# Patient Record
Sex: Male | Born: 1975 | Race: Black or African American | Hispanic: No | Marital: Married | Smoking: Current some day smoker
Health system: Southern US, Community
[De-identification: ages and names within clinical notes are randomized; demographics above are authoritative.]

## PROBLEM LIST (undated history)

## (undated) DIAGNOSIS — I252 Old myocardial infarction: Secondary | ICD-10-CM

## (undated) HISTORY — PX: CARDIAC CATHETERIZATION: SHX172

---

## 2004-08-13 ENCOUNTER — Emergency Department (HOSPITAL_COMMUNITY): Admission: EM | Admit: 2004-08-13 | Discharge: 2004-08-13 | Payer: Self-pay | Admitting: Emergency Medicine

## 2005-01-02 ENCOUNTER — Ambulatory Visit: Payer: Self-pay | Admitting: Gastroenterology

## 2006-08-10 ENCOUNTER — Emergency Department (HOSPITAL_COMMUNITY): Admission: EM | Admit: 2006-08-10 | Discharge: 2006-08-10 | Payer: Self-pay | Admitting: Emergency Medicine

## 2007-11-15 ENCOUNTER — Encounter: Payer: Self-pay | Admitting: Emergency Medicine

## 2007-11-15 ENCOUNTER — Inpatient Hospital Stay (HOSPITAL_COMMUNITY): Admission: AD | Admit: 2007-11-15 | Discharge: 2007-11-18 | Payer: Self-pay | Admitting: Cardiovascular Disease

## 2007-11-16 ENCOUNTER — Encounter (INDEPENDENT_AMBULATORY_CARE_PROVIDER_SITE_OTHER): Payer: Self-pay | Admitting: Cardiovascular Disease

## 2007-12-27 ENCOUNTER — Inpatient Hospital Stay (HOSPITAL_COMMUNITY): Admission: AD | Admit: 2007-12-27 | Discharge: 2007-12-30 | Payer: Self-pay | Admitting: Cardiovascular Disease

## 2007-12-27 ENCOUNTER — Encounter: Payer: Self-pay | Admitting: Emergency Medicine

## 2008-02-26 ENCOUNTER — Encounter (HOSPITAL_COMMUNITY): Admission: RE | Admit: 2008-02-26 | Discharge: 2008-05-26 | Payer: Self-pay | Admitting: Cardiovascular Disease

## 2008-03-28 ENCOUNTER — Emergency Department (HOSPITAL_COMMUNITY): Admission: EM | Admit: 2008-03-28 | Discharge: 2008-03-29 | Payer: Self-pay | Admitting: Emergency Medicine

## 2008-05-27 ENCOUNTER — Encounter (HOSPITAL_COMMUNITY): Admission: RE | Admit: 2008-05-27 | Discharge: 2008-07-02 | Payer: Self-pay | Admitting: Cardiovascular Disease

## 2008-08-20 ENCOUNTER — Emergency Department (HOSPITAL_COMMUNITY): Admission: EM | Admit: 2008-08-20 | Discharge: 2008-08-20 | Payer: Self-pay | Admitting: Emergency Medicine

## 2009-12-28 ENCOUNTER — Emergency Department (HOSPITAL_COMMUNITY): Admission: EM | Admit: 2009-12-28 | Discharge: 2009-12-28 | Payer: Self-pay | Admitting: Family Medicine

## 2010-09-11 ENCOUNTER — Emergency Department (HOSPITAL_COMMUNITY)
Admission: EM | Admit: 2010-09-11 | Discharge: 2010-09-11 | Disposition: A | Discharge status: Home / Self Care | Payer: BC Managed Care – PPO | Attending: Emergency Medicine | Admitting: Emergency Medicine

## 2010-09-11 DIAGNOSIS — L299 Pruritus, unspecified: Secondary | ICD-10-CM | POA: Insufficient documentation

## 2010-10-10 ENCOUNTER — Emergency Department (HOSPITAL_COMMUNITY)
Admission: EM | Admit: 2010-10-10 | Discharge: 2010-10-10 | Disposition: A | Discharge status: Home / Self Care | Payer: BC Managed Care – PPO | Attending: Emergency Medicine | Admitting: Emergency Medicine

## 2010-10-10 DIAGNOSIS — L299 Pruritus, unspecified: Secondary | ICD-10-CM | POA: Insufficient documentation

## 2010-11-28 NOTE — Cardiovascular Report (Signed)
Drew Hubbard, Drew Hubbard               ACCOUNT NO.:  [id number]   MEDICAL RECORD NO.:  [id number]          PATIENT TYPE:  INP   LOCATION:  3306                         FACILITY:  [facility]   PHYSICIAN:  Ricki Rodriguez, M.D.  DATE OF BIRTH:  04-May-1976   DATE OF PROCEDURE:  11/17/2007  DATE OF DISCHARGE:  11/18/2007                            CARDIAC CATHETERIZATION   PROCEDURES:  1. Left heart catheterization.  2. Selective coronary angiography.   INDICATIONS:  This 35 year old black male had non-Q-wave myocardial  infarction along with a history of cocaine use.   I approached right femoral artery using 5-French sheath.   COMPLICATIONS:  None.   CORONARY ANATOMY:  The left main coronary artery was unremarkable.   LEFT ANTERIOR DESCENDING CORONARY ARTERY:  The left anterior descending  coronary artery showed proximal-to-mid one-third ejection and 40%  concentric lesion with a slow filling distally.   LEFT CIRCUMFLEX CORONARY ARTERY:  The left circumflex coronary artery  was unremarkable.   RIGHT CORONARY ARTERY:  The right coronary artery was also unremarkable.   LEFT VENTRICULOGRAM:  The left ventriculogram showed a 60% ejection  fraction by echocardiogram.   IMPRESSION:  1. One-vessel coronary artery disease.  2. Preserved left ventricular systolic function.   RECOMMENDATIONS:  This patient will be treated medically with lifestyle  modification.      Ricki Rodriguez, M.D.  Electronically Signed     ASK/MEDQ  D:  12/27/2007  T:  12/28/2007  Job:  [id number]

## 2010-11-28 NOTE — H&P (Signed)
Drew, Hubbard               ACCOUNT NO.:  [id number]   MEDICAL RECORD NO.:  [id number]          PATIENT TYPE:  INP   LOCATION:  2906                         FACILITY:  [facility]   PHYSICIAN:  Ricki Rodriguez, M.D.  DATE OF BIRTH:  1975/09/11   DATE OF ADMISSION:  12/27/2007  DATE OF DISCHARGE:                              HISTORY & PHYSICAL   The patient admitted by Ricki Rodriguez, MD   The patient referred by [facility].   CHIEF COMPLAINT:  Chest pain.   HISTORY OF PRESENT ILLNESS:  This 35 year old black male had recurrent  chest pain after taking cocaine, alcohol, marijuana, and some other  medications/drugs.  The patient had a similar episode about a month ago  at which time the cardiac catheterization had showed 40% eccentric  lesion.  At this time his EKG changes are remarkable with acute  anterolateral wall injury.  At the time of cath lab arrival, the patient  was chest pain free.   PAST MEDICAL HISTORY:  Negative for diabetes.  Negative for  hypertension.  Positive for smoking, one pack of cigarettes per day for  5-10 years.  Positive for alcohol use and positive for drug abuse.  Negative for elevated cholesterol level.  Positive for exercise.   FAMILY HISTORY:  Mother alive in her 1s.  Father alive in his 50s.  The  patient has no brother and has four sisters.   SOCIAL HISTORY:  The patient is married and works for [employer] and  [employer].   DRUG ALLERGIES:  No known drug allergies.   MEDICATIONS:  The patient is suppose to be on 4-5 medications and does  not remember the name, but he is at least taking aspirin and possibly  Plavix 75 mg one daily.  He was also suppose to be on lisinopril,  Norvasc, and Simvastatin.   REVIEW OF SYSTEMS:  Negative for weight gain or weight loss.  Negative  for hearing loss.  Negative for GI bleed, kidney stones, strokes,  seizures, or psychiatric admissions.  Positive for chest pain.   PHYSICAL EXAMINATION:  VITAL  SIGNS:  Pulse 73, respiration 12, blood  pressure 110/60, temperature 98, oxygen saturation 99% on 2 liters of  oxygen.  GENERAL:  The patient is well-built, well-nourished black male in no  acute distress.  HEENT:  The patient is normocephalic, atraumatic.  He has short hair,  brown eyes, and pupils equally reacting to light.  Extraocular movement  intact.  NECK:  No JVD.  LUNGS:  Clear bilaterally.  HEART:  Normal S1-S2.  ABDOMEN:  Soft and nontender.  EXTREMITIES:  Negative edema, cyanosis, clubbing.  Peripheral pulses  were 2+.  SKIN:  Warm and dry.  NEUROLOGIC:  The patient moves all 4 extremities.  He has slightly flat  affect.   LABORATORY DATA:  From [facility] pending.  EKG showed acute ST  elevations in V2, V3, V4, V5, and V6 and also slight ST elevations in  II, III, and aVF   IMPRESSION:  1. Acute anterolateral wall myocardial infarction.  2. Coronary artery disease.   PLAN  OF ACTION:  The patient will undergo emergent cardiac  catheterization with possible angioplasty.  Dr. Jacinto Halim was notified.      Ricki Rodriguez, M.D.  Electronically Signed     ASK/MEDQ  D:  12/27/2007  T:  12/27/2007  Job:  [id number]

## 2010-11-28 NOTE — Discharge Summary (Signed)
[name], Hubbard               ACCOUNT NO.:  [id number]   MEDICAL RECORD NO.:  [id number]          PATIENT TYPE:  INP   LOCATION:  2030                         FACILITY:  [facility]   PHYSICIAN:  Ricki Rodriguez, M.D.  DATE OF BIRTH:  11-Jan-1976   DATE OF ADMISSION:  12/27/2007  DATE OF DISCHARGE:  12/30/2007                               DISCHARGE SUMMARY   FINAL DIAGNOSES:  1. Anterolateral acute myocardial infarction, initial episode.  2. Coronary atherosclerosis of native coronary vessel.  3. Tobacco use disorder.  4. Cannabis abuse.  5. Cocaine abuse.  6. Alcohol abuse.   PRINCIPAL PROCEDURE:  Left heart catheterization, coronary angiography  done by Dr. Orpah Cobb, and PTCA stent placement by Dr. Yates Decamp.   DISCHARGE MEDICATIONS:  1. Aspirin 325 mg 1 daily.  2. Plavix 75 mg 1 daily.  3. Simvastatin 80 mg 1 daily.  4. Lisinopril 5 mg 1 daily.  5. Amlodipine 5 mg half a tablet daily.  6. Coreg 6.25 mg 1 twice daily.  7. Nicotine patch 21 mg apply new one daily.  8. Nitroglycerin 0.4 mg tablet 1 under the tongue every 5 minutes x3      as needed for chest pain.   DISCHARGE ACTIVITY:  The patient to increase activity slowly.   WOUND CARE INSTRUCTIONS:  The patient to notify right groin pain,  discharge.   DISCHARGE DIET:  Low-sodium, heart-healthy diet.   SPECIAL INSTRUCTION:  The patient to stop any activity that causes chest  pain, shortness of breath, dizziness, sweating, or excessive weakness.   CONDITION ON DISCHARGE:  Improved.   FOLLOWUP:  By Dr. Orpah Cobb in 1-2 weeks.  The patient to call [phone] for appointment.   HISTORY:  This is a 35 year old black male presented with chest pain  recurrent along with some cocaine abuse.   PHYSICAL EXAMINATION:  GENERAL:  Temperature 97.8, pulse 55,  respirations 16, blood pressure 113/73, and oxygen saturation 99% on  room air.  HEENT:  The patient is normocephalic and atraumatic.  Has brown eyes.  NECK:   No JVD.  No carotid bruits.  LUNGS:  Clear bilaterally.  HEART:  Normal S1, S2 without S3 or gallop.  ABDOMEN:  Soft and nontender.  EXTREMITIES:  No edema, cyanosis, or clubbing.   LABORATORY DATA:  Normal hemoglobin, hematocrit, slightly low WBC count,  normal platelet count, normal electrolytes, BUN, and creatinine.  Sugar  borderline at 101.  CK 276, MB 15.5, troponin I is 1.7.  Subsequent CK-  MB peaked at 312, MB of 20.5, and troponin I of 2.46.  Cholesterol  profile near normal.   Cardiac catheterization showed significant left anterior descending  coronary artery disease, PTCA stent of LAD with a 4-mm x 12-mm Multi-  Link Vision stent was successful.   HOSPITAL COURSE:  The patient was admitted to Telemetry unit, myocardial  infarction was ruled in.  His chest pain, abnormal EKG, and abnormal  blood work required the patient to be brought to the cardiac  catheterization laboratory.  He underwent PTCA stent placement in left  anterior descending  coronary artery due to eccentric 52% stenosis with  ruptured flap.  His overall condition gradually improved.  His activity  was increased and on December 30, 2007, he was discharged home in  satisfactory condition with adjustment in his medications and a strong  advise on changing the lifestyle.      Ricki Rodriguez, M.D.  Electronically Signed     ASK/MEDQ  D:  03/15/2008  T:  03/16/2008  Job:  [id number]

## 2011-04-12 LAB — BASIC METABOLIC PANEL
BUN: 12
CO2: 23
Calcium: 9.6
Calcium: 8.7
Chloride: 111
Chloride: 109
Creatinine, Ser: 1.12
GFR calc Af Amer: >60
GFR calc Af Amer: >60
GFR calc non Af Amer: >60
Glucose, Bld: 100 — ABNORMAL HIGH
Glucose, Bld: 101 — ABNORMAL HIGH
Potassium: 3.1 — ABNORMAL LOW
Potassium: 3.8
Sodium: 141

## 2011-04-12 LAB — URINALYSIS, ROUTINE W REFLEX MICROSCOPIC
Bilirubin Urine: NEGATIVE
Nitrite: NEGATIVE
Protein, ur: NEGATIVE
Specific Gravity, Urine: >1.03 — ABNORMAL HIGH
Urobilinogen, UA: 0.2

## 2011-04-12 LAB — CBC
HCT: 43.7
Hemoglobin: 13.3
MCHC: 33.8
MCHC: 33.7
MCV: 86.9
Platelets: 228
RBC: 5.02
RDW: 12.5
WBC: 6.3

## 2011-04-12 LAB — RAPID URINE DRUG SCREEN, HOSP PERFORMED
Barbiturates: NOT DETECTED
Benzodiazepines: NOT DETECTED

## 2011-04-12 LAB — CK TOTAL AND CKMB (NOT AT ARMC)
Relative Index: 0.8
Relative Index: 1.6
Total CK: 233 — ABNORMAL HIGH
Total CK: 171

## 2011-04-12 LAB — CARDIAC PANEL(CRET KIN+CKTOT+MB+TROPI)
CK, MB: 14 — ABNORMAL HIGH
CK, MB: 20.5 — ABNORMAL HIGH
Relative Index: 5.3 — ABNORMAL HIGH
Relative Index: 6.6 — ABNORMAL HIGH
Total CK: 262 — ABNORMAL HIGH
Total CK: 276 — ABNORMAL HIGH
Total CK: 312 — ABNORMAL HIGH
Troponin I: 2.46

## 2011-04-12 LAB — DIFFERENTIAL
Eosinophils Absolute: 0.1
Lymphocytes Relative: 44
Monocytes Absolute: 0.5

## 2011-04-12 LAB — LIPID PANEL
Cholesterol: 116
VLDL: 22

## 2011-04-23 ENCOUNTER — Emergency Department (HOSPITAL_COMMUNITY): Payer: BC Managed Care – PPO

## 2011-04-23 ENCOUNTER — Emergency Department (HOSPITAL_COMMUNITY)
Admission: EM | Admit: 2011-04-23 | Discharge: 2011-04-23 | Disposition: A | Discharge status: Home / Self Care | Payer: BC Managed Care – PPO | Attending: Emergency Medicine | Admitting: Emergency Medicine

## 2011-04-23 ENCOUNTER — Encounter: Payer: Self-pay | Admitting: Emergency Medicine

## 2011-04-23 DIAGNOSIS — R059 Cough, unspecified: Secondary | ICD-10-CM

## 2011-04-23 DIAGNOSIS — R062 Wheezing: Secondary | ICD-10-CM | POA: Insufficient documentation

## 2011-04-23 DIAGNOSIS — F17200 Nicotine dependence, unspecified, uncomplicated: Secondary | ICD-10-CM | POA: Insufficient documentation

## 2011-04-23 DIAGNOSIS — R05 Cough: Secondary | ICD-10-CM

## 2011-04-23 MED ORDER — GUAIFENESIN-CODEINE 100-10 MG/5ML PO SYRP: ORAL_SOLUTION | ORAL | Status: DC

## 2011-04-23 MED ORDER — ALBUTEROL SULFATE HFA 108 (90 BASE) MCG/ACT IN AERS
2.0000 | INHALATION_SPRAY | RESPIRATORY_TRACT | Status: DC | PRN
  Administered 2011-04-23: 2 via RESPIRATORY_TRACT
  Filled 2011-04-23: qty 6.7

## 2011-04-23 NOTE — ED Notes (Signed)
Cough/congested with sob x 1 week. Nad. No resp distress noted. Denies prod cough.

## 2011-04-23 NOTE — ED Provider Notes (Signed)
History     CSN: [id number] Arrival date & time: 04/23/2011  7:22 AM  Chief Complaint  Patient presents with  . Cough    (Consider location/radiation/quality/duration/timing/severity/associated sxs/prior treatment) Patient is a 35 y.o. male presenting with cough. The history is provided by the patient. No language interpreter was used.  Cough This is a new problem. The current episode started more than 1 week ago. The problem occurs every few minutes. The problem has not changed since onset.The cough is non-productive. There has been no fever. Associated symptoms include wheezing. Pertinent negatives include no chest pain, no chills, no sweats and no shortness of breath. He has tried nothing for the symptoms. He is a smoker. His past medical history does not include bronchitis, pneumonia, bronchiectasis, COPD, emphysema or asthma.    History reviewed. No pertinent past medical history.  History reviewed. No pertinent past surgical history.  History reviewed. No pertinent family history.  History  Substance Use Topics  . Smoking status: Current Some Day Smoker  . Smokeless tobacco: Not on file  . Alcohol Use: No      Review of Systems  Constitutional: Negative for chills.  Respiratory: Positive for cough and wheezing. Negative for shortness of breath.   Cardiovascular: Negative for chest pain.  All other systems reviewed and are negative.    Allergies  Review of patient's allergies indicates not on file.  Home Medications  No current outpatient prescriptions on file.  BP 141/64  Pulse 84  Temp(Src) 98.3 F (36.8 C) (Oral)  Resp 19  SpO2 97%  Physical Exam  Nursing note and vitals reviewed. Constitutional: He is oriented to person, place, and time. Vital signs are normal. He appears well-developed and well-nourished. No distress.  HENT:  Head: Normocephalic and atraumatic.  Right Ear: External ear normal.  Left Ear: External ear normal.  Nose: Nose normal.    Mouth/Throat: No oropharyngeal exudate.  Eyes: Conjunctivae and EOM are normal. Pupils are equal, round, and reactive to light. Right eye exhibits no discharge. Left eye exhibits no discharge. No scleral icterus.  Neck: Normal range of motion. Neck supple. No JVD present. No tracheal deviation present. No thyromegaly present.  Cardiovascular: Normal rate, regular rhythm, normal heart sounds, intact distal pulses and normal pulses.  Exam reveals no gallop and no friction rub.   No murmur heard. Pulmonary/Chest: Effort normal. No stridor. No respiratory distress. He has wheezes in the left upper field. He has no rhonchi. He has no rales. He exhibits no tenderness.  Abdominal: Soft. Normal appearance and bowel sounds are normal. He exhibits no distension and no mass. There is no tenderness. There is no rebound and no guarding.  Musculoskeletal: Normal range of motion. He exhibits no edema and no tenderness.  Lymphadenopathy:    He has no cervical adenopathy.  Neurological: He is alert and oriented to person, place, and time. He has normal reflexes. Coordination normal. GCS eye subscore is 4. GCS verbal subscore is 5. GCS motor subscore is 6.  Skin: Skin is warm and dry. No rash noted. He is not diaphoretic.  Psychiatric: He has a normal mood and affect. His speech is normal and behavior is normal. Judgment and thought content normal. Cognition and memory are normal.    ED Course  Procedures (including critical care time)  Labs Reviewed - No data to display No results found.   No diagnosis found.    MDM          Worthy Rancher, PA 04/23/11 [phone]

## 2011-04-25 NOTE — ED Provider Notes (Signed)
Medical screening examination/treatment/procedure(s) were performed by non-physician practitioner and as supervising physician I was immediately available for consultation/collaboration.  Mariah Gerstenberger, MD 04/25/11 1148 

## 2013-07-20 IMAGING — CR DG CHEST 2V
2 series · 2 of 2 positions shown · non-contrast
Comparison: 12/27/2007

CLINICAL DATA: Cough, shortness of breath.

CHEST - 2 VIEW

[view not recorded (1 of 2)]
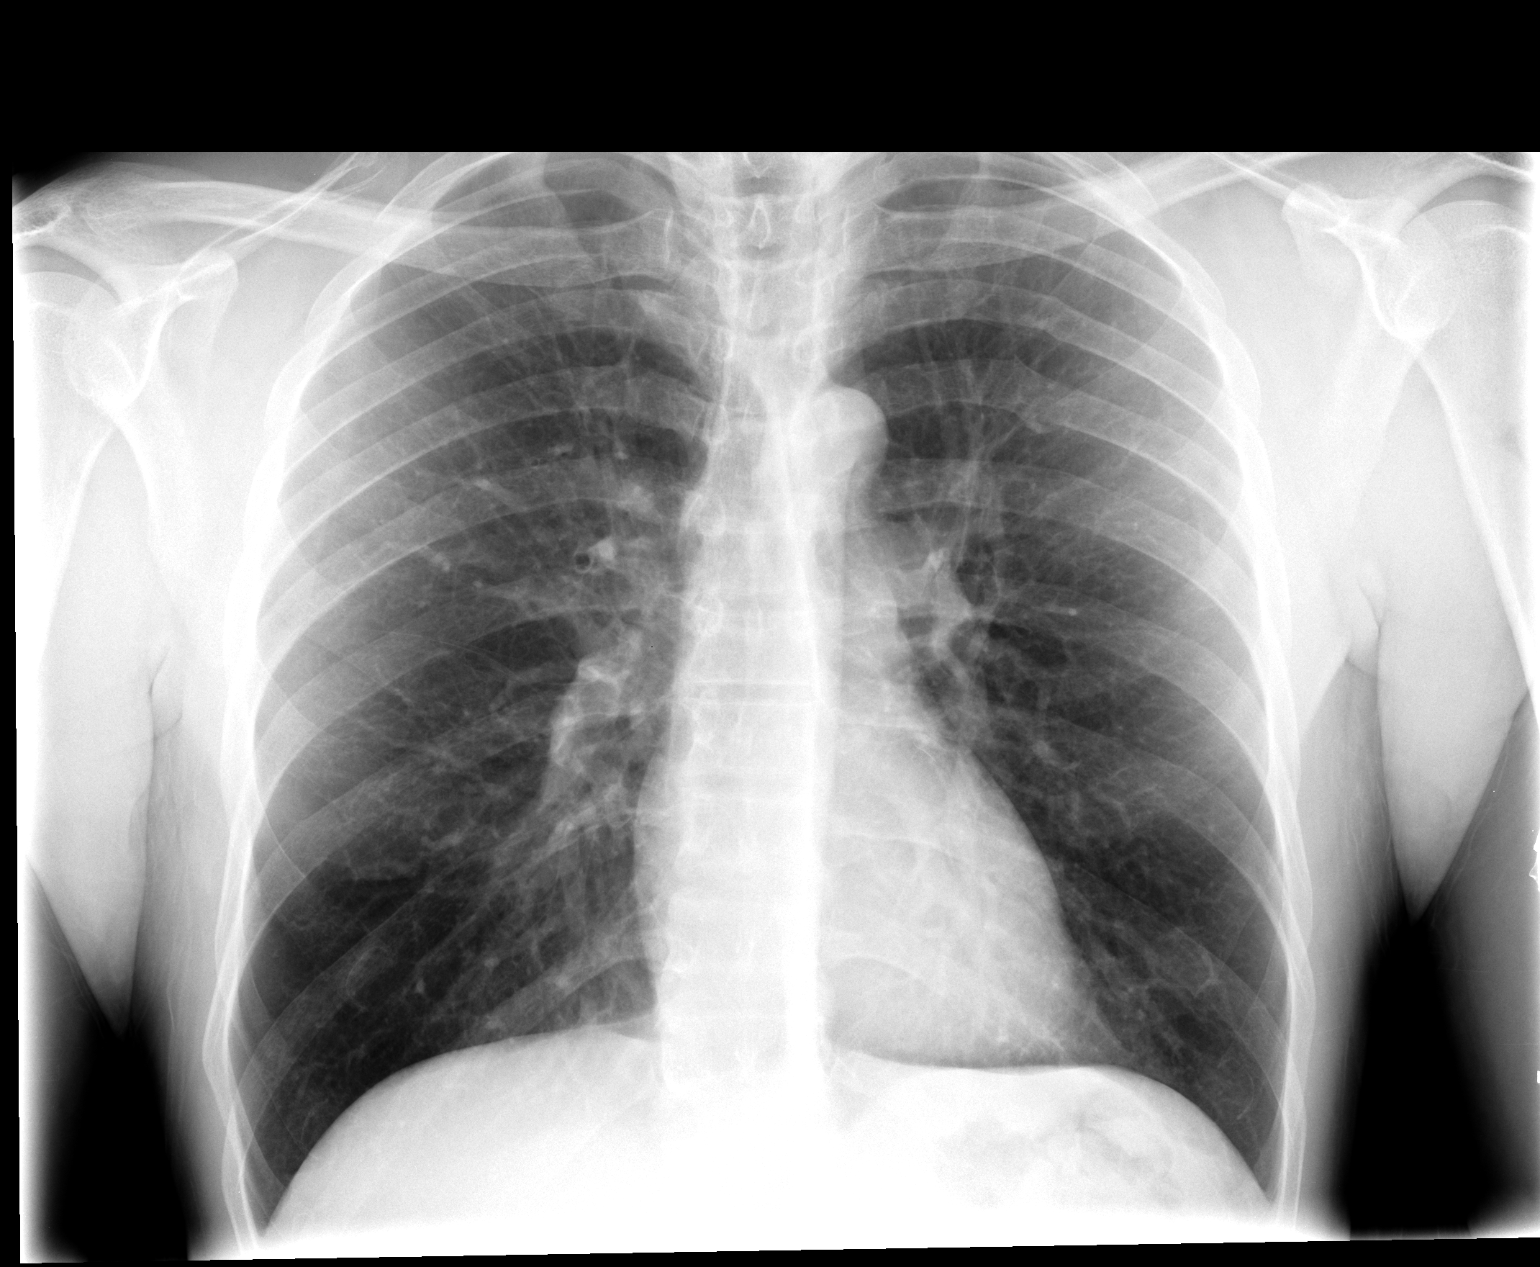

[view not recorded (2 of 2)]
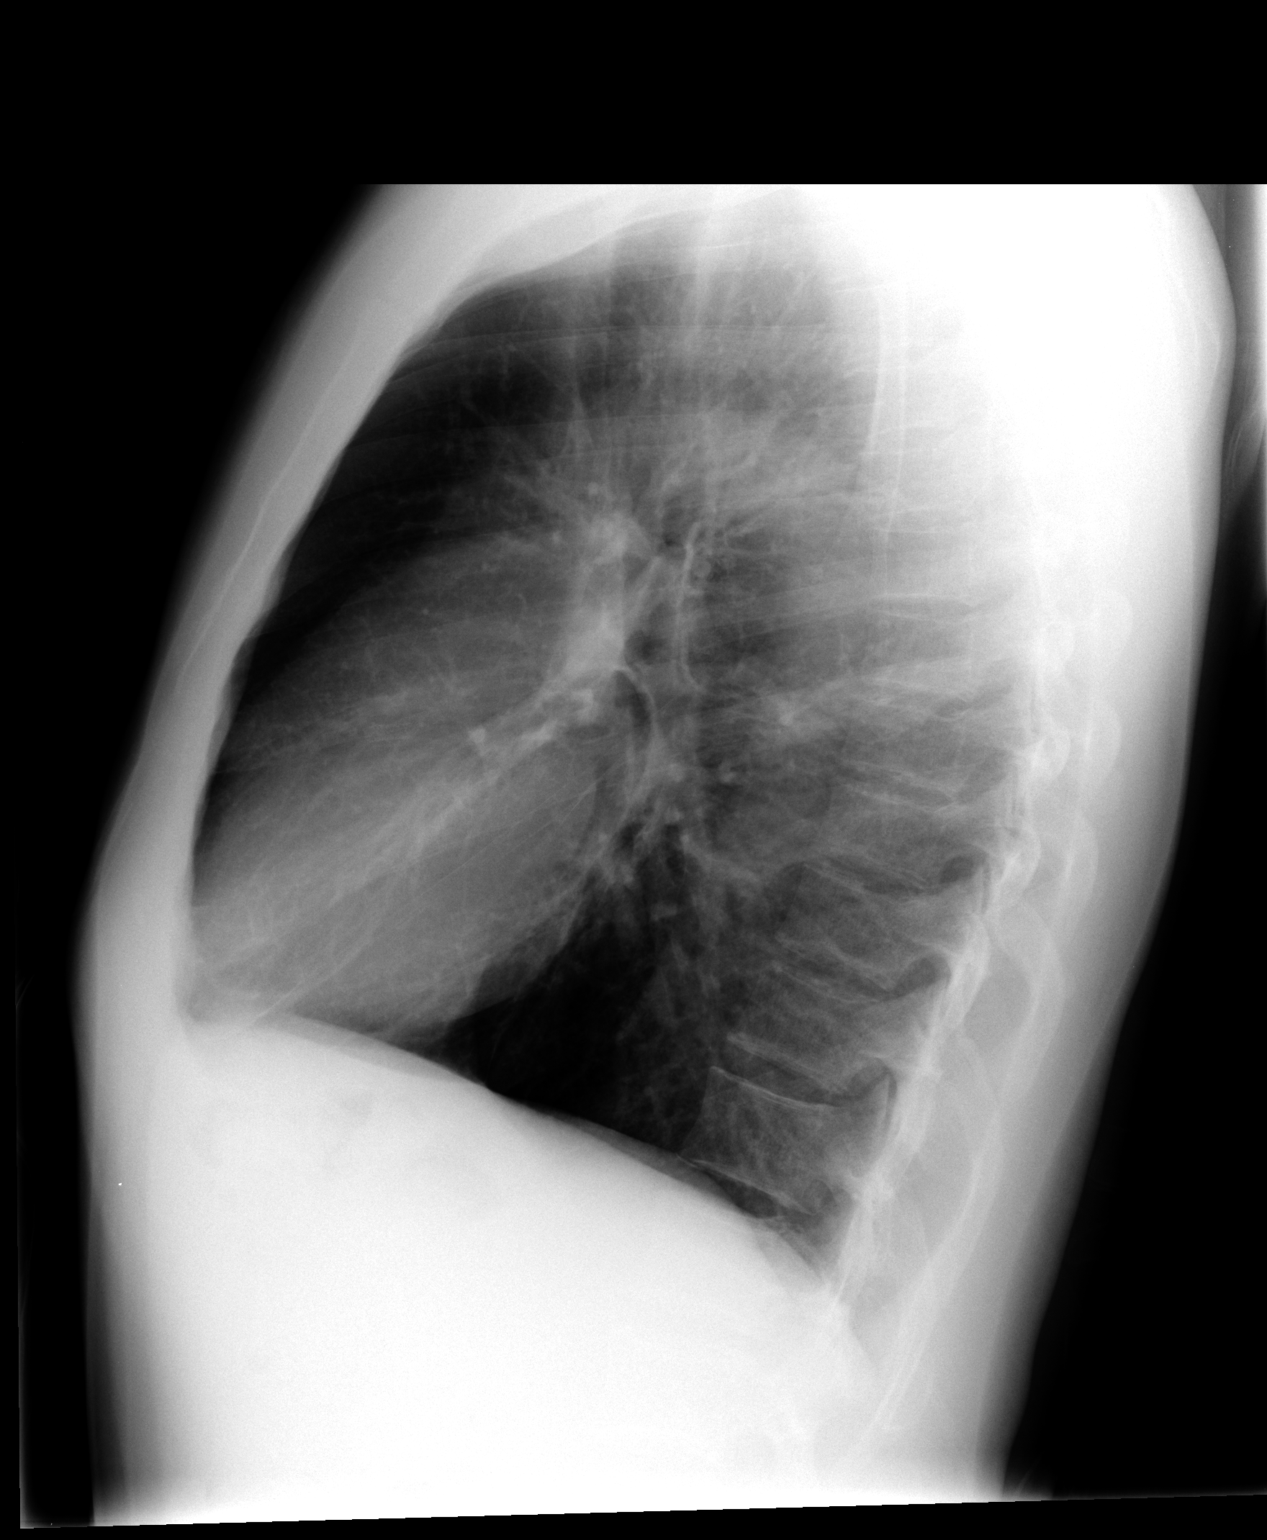

[2 of 2 positions shown; findings below may reference images not displayed]

FINDINGS: Heart and mediastinal contours are within normal limits.
No focal opacities or effusions.  No acute bony abnormality.
IMPRESSION: No active disease.

## 2018-07-08 ENCOUNTER — Emergency Department (HOSPITAL_COMMUNITY)
Admission: EM | Admit: 2018-07-08 | Discharge: 2018-07-08 | Disposition: A | Discharge status: Home / Self Care | Payer: BLUE CROSS/BLUE SHIELD | Attending: Emergency Medicine | Admitting: Emergency Medicine

## 2018-07-08 ENCOUNTER — Encounter (HOSPITAL_COMMUNITY): Payer: Self-pay | Admitting: *Deleted

## 2018-07-08 ENCOUNTER — Other Ambulatory Visit: Payer: Self-pay

## 2018-07-08 ENCOUNTER — Emergency Department (HOSPITAL_COMMUNITY): Payer: BLUE CROSS/BLUE SHIELD

## 2018-07-08 DIAGNOSIS — R0981 Nasal congestion: Secondary | ICD-10-CM | POA: Insufficient documentation

## 2018-07-08 DIAGNOSIS — I10 Essential (primary) hypertension: Secondary | ICD-10-CM

## 2018-07-08 DIAGNOSIS — R0789 Other chest pain: Secondary | ICD-10-CM | POA: Diagnosis present

## 2018-07-08 DIAGNOSIS — F17210 Nicotine dependence, cigarettes, uncomplicated: Secondary | ICD-10-CM | POA: Insufficient documentation

## 2018-07-08 HISTORY — DX: Old myocardial infarction: I25.2

## 2018-07-08 LAB — CBC
HCT: 46.8 % (ref 39.0–52.0)
Hemoglobin: 14.6 g/dL (ref 13.0–17.0)
MCH: 26.8 pg (ref 26.0–34.0)
MCHC: 31.2 g/dL (ref 30.0–36.0)
MCV: 86 fL (ref 80.0–100.0)
PLATELETS: 276 10*3/uL (ref 150–400)
RBC: 5.44 MIL/uL (ref 4.22–5.81)
RDW: 12.5 % (ref 11.5–15.5)
WBC: 11.9 10*3/uL — ABNORMAL HIGH (ref 4.0–10.5)
nRBC: 0 % (ref 0.0–0.2)

## 2018-07-08 LAB — BASIC METABOLIC PANEL
Anion gap: 9 (ref 5–15)
BUN: 12 mg/dL (ref 6–20)
CALCIUM: 9.5 mg/dL (ref 8.9–10.3)
CO2: 24 mmol/L (ref 22–32)
Chloride: 103 mmol/L (ref 98–111)
Creatinine, Ser: 1.12 mg/dL (ref 0.61–1.24)
GFR calc Af Amer: >60 mL/min (ref >60)
GLUCOSE: 114 mg/dL — AB (ref 70–99)
Potassium: 3.7 mmol/L (ref 3.5–5.1)
Sodium: 136 mmol/L (ref 135–145)

## 2018-07-08 LAB — TROPONIN I

## 2018-07-08 LAB — I-STAT TROPONIN, ED: Troponin i, poc: 0 ng/mL (ref 0.00–0.08)

## 2018-07-08 MED ORDER — OXYMETAZOLINE HCL 0.05 % NA SOLN
2.0000 | Freq: Once | NASAL | Status: AC
  Administered 2018-07-08: 2 via NASAL
  Filled 2018-07-08: qty 15

## 2018-07-08 NOTE — Discharge Instructions (Signed)
Continue the Afrin nasal spray for the next 3 to 4 days and then stop.  You can get Nasacort nasal spray over-the-counter and take for the swelling in your nose.  Use heat over your face.  This will help your sinuses drain.  Take Zyrtec over-the-counter once a day which will help with the swelling in your nose.  Recheck if your symptoms are not improving over the next 7 to 10 days or if you get a fever.  Your blood pressure was elevated today, you need to get a primary doctor to see if you need to be on blood pressure medications.

## 2018-07-08 NOTE — ED Notes (Signed)
Patient transported to X-ray 

## 2018-07-08 NOTE — ED Triage Notes (Signed)
Pt c/o chest tightness and unable to breath through his nose; pt states he has taken dayquil and bayer aspirin

## 2018-07-08 NOTE — ED Provider Notes (Signed)
[facility] Provider Note   CSN: [id number] Arrival date & time: 07/08/18  [id number]  Time seen 04:05 AM   History   Chief Complaint Chief Complaint  Patient presents with  . Chest Pain    HPI Drew Hubbard is a 42 y.o. male.  HPI patient states he had gone to a friend's house to watch the games tonight and when he got home about 11 PM he had a lot of nasal stuffiness and states if he blows his nose he has a lot of white drainage.  He denies any sneezing, sore throat, cough.  He states he had a headache and he took 2 aspirin.  About midnight he stated his chest started feeling tight and he felt like he was having trouble breathing but he felt like it was because his nose was stuffed shut.  He denies wheezing, nausea, and vomiting, diarrhea, or fever.  He states the chest tightness feels different from when he had his MI.  PCP None   Past Medical History:  Diagnosis Date  . Myocardial infarct, old     There are no active problems to display for this patient.   Past Surgical History:  Procedure Laterality Date  . CARDIAC CATHETERIZATION          Home Medications    Prior to Admission medications   Medication Sig Start Date End Date Taking? Authorizing Provider  guaiFENesin-codeine [facility](ROBITUSSIN AC) 100-10 MG/5ML syrup 5-10 ml q 4-6 hrs prn cough 04/23/11   Worthy RancherMiller, Richard M, PA-C    Family History History reviewed. No pertinent family history.  Social History Social History   Tobacco Use  . Smoking status: Current Some Day Smoker    Packs/day: 1.00  . Smokeless tobacco: Never Used  Substance Use Topics  . Alcohol use: Yes    Alcohol/week: 1.0 - 2.0 standard drinks    Types: 1 - 2 Cans of beer per week    Comment: daily  . Drug use: Yes    Types: Marijuana  Patient drives a truck Patient lives with spouse   Allergies   Patient has no known allergies.   Review of Systems Review of Systems  All other systems reviewed and are  negative.    Physical Exam Updated Vital Signs BP (!) 152/91   Pulse 82   Temp 98.1 F (36.7 C)   Resp 17   Ht 5\' 9"  (1.753 m)   Wt 83.9 kg   SpO2 100%   BMI 27.32 kg/m   Physical Exam Vitals signs and nursing note reviewed.  Constitutional:      General: He is not in acute distress.    Appearance: Normal appearance. He is well-developed. He is not ill-appearing or toxic-appearing.  HENT:     Head: Normocephalic and atraumatic.     Right Ear: External ear normal.     Left Ear: External ear normal.     Nose: Mucosal edema and rhinorrhea present.     Right Turbinates: Enlarged, swollen and pale.     Left Turbinates: Enlarged, swollen and pale.     Right Sinus: No maxillary sinus tenderness or frontal sinus tenderness.     Left Sinus: No maxillary sinus tenderness or frontal sinus tenderness.     Comments: Patient has no air space in his nares, there is a lot of white drainage seen    Mouth/Throat:     Dentition: No dental abscesses.     Pharynx: No uvula swelling.  Eyes:  Conjunctiva/sclera: Conjunctivae normal.     Pupils: Pupils are equal, round, and reactive to light.  Neck:     Musculoskeletal: Full passive range of motion without pain, normal range of motion and neck supple.  Cardiovascular:     Rate and Rhythm: Normal rate and regular rhythm.     Heart sounds: Normal heart sounds. No murmur. No friction rub. No gallop.   Pulmonary:     Effort: Pulmonary effort is normal. No respiratory distress.     Breath sounds: Normal breath sounds. No wheezing, rhonchi or rales.  Chest:     Chest wall: No tenderness or crepitus.  Abdominal:     General: Bowel sounds are normal. There is no distension.     Palpations: Abdomen is soft.     Tenderness: There is no abdominal tenderness. There is no guarding or rebound.  Musculoskeletal: Normal range of motion.        General: No tenderness.     Comments: Moves all extremities well.   Skin:    General: Skin is warm and  dry.     Coloration: Skin is not pale.     Findings: No erythema or rash.  Neurological:     Mental Status: He is alert and oriented to person, place, and time.     Cranial Nerves: No cranial nerve deficit.  Psychiatric:        Mood and Affect: Mood is not anxious.        Speech: Speech normal.        Behavior: Behavior normal.      ED Treatments / Results  Labs (all labs ordered are listed, but only abnormal results are displayed) Results for orders placed or performed during the hospital encounter of 07/08/18  Basic metabolic panel  Result Value Ref Range   Sodium 136 135 - 145 mmol/L   Potassium 3.7 3.5 - 5.1 mmol/L   Chloride 103 98 - 111 mmol/L   CO2 24 22 - 32 mmol/L   Glucose, Bld 114 (H) 70 - 99 mg/dL   BUN 12 6 - 20 mg/dL   Creatinine, Ser 1.611.12 0.61 - 1.24 mg/dL   Calcium 9.5 8.9 - 09.610.3 mg/dL   GFR calc non Af Amer >60 >60 mL/min   GFR calc Af Amer >60 >60 mL/min   Anion gap 9 5 - 15  CBC  Result Value Ref Range   WBC 11.9 (H) 4.0 - 10.5 K/uL   RBC 5.44 4.22 - 5.81 MIL/uL   Hemoglobin 14.6 13.0 - 17.0 g/dL   HCT 04.546.8 40.939.0 - 81.152.0 %   MCV 86.0 80.0 - 100.0 fL   MCH 26.8 26.0 - 34.0 pg   MCHC 31.2 30.0 - 36.0 g/dL   RDW 91.412.5 78.211.5 - 95.615.5 %   Platelets 276 150 - 400 K/uL   nRBC 0.0 0.0 - 0.2 %  Troponin I - ONCE - STAT  Result Value Ref Range   Troponin I <0.03 <0.03 ng/mL  I-stat troponin, ED  Result Value Ref Range   Troponin i, poc 0.00 0.00 - 0.08 ng/mL   Comment 3           Laboratory interpretation all normal except leukocytosis    EKG None  ED ECG REPORT   Date: 07/08/2018  Rate: 101  Rhythm: sinus tachycardia  QRS Axis: normal  Intervals: normal  ST/T Wave abnormalities: normal  Conduction Disutrbances:none  Narrative Interpretation:   Old EKG Reviewed: none available  I have personally reviewed  the EKG tracing and agree with the computerized printout as noted.   Radiology Dg Chest 2 View  Result Date: 07/08/2018 CLINICAL DATA:   Acute onset of mid chest tightness and shortness of breath. EXAM: CHEST - 2 VIEW COMPARISON:  Chest radiograph performed 04/23/2011 FINDINGS: The lungs are well-aerated. Minimal left basilar opacity likely reflects atelectasis. There is no evidence of pleural effusion or pneumothorax. The heart is normal in size; the mediastinal contour is within normal limits. No acute osseous abnormalities are seen. IMPRESSION: Minimal left basilar opacity likely reflects atelectasis. Electronically Signed   By: Roanna Raider M.D.   On: 07/08/2018 04:05    Procedures Procedures (including critical care time)  Medications Ordered in ED Medications  oxymetazoline (AFRIN) 0.05 % nasal spray 2 spray (2 sprays Each Nare Given 07/08/18 0425)     Initial Impression / Assessment and Plan / ED Course  I have reviewed the triage vital signs and the nursing notes.  Pertinent labs & imaging results that were available during my care of the patient were reviewed by me and considered in my medical decision making (see chart for details).     Patient was given Afrin nasal spray in the ED.  His blood pressures remained in the 150/90-100 range in the ED visit.  Laboratory testing was done.  Patient has rhinitis with complete occlusion of his nasal cavities.  He does not have evidence of sinusitis, he has no fever or pain over the sinuses when palpated.  He can take over-the-counter Zyrtec, Nasacort spray, and continue the Afrin.  I stressed to him that he should only take the Afrin for the next 3 to 4 days and then stop.  He also is encouraged to get a primary care doctor to manage his blood pressure.  He is mildly elevated in the ED, he states he has had to be treated for blood pressure before.  Patient's delta troponin is negative.  At this point he has had chest pain for about 5 hours and it should be positive if he was having a myocardial event.  Final Clinical Impressions(s) / ED Diagnoses   Final diagnoses:    Nasal congestion  Hypertension, unspecified type    ED Discharge Orders    None    OTC medications  Plan discharge  Devoria Albe, MD, Concha Pyo, MD 07/08/18 [phone]

## 2020-10-04 IMAGING — DX DG CHEST 2V
2 series · 2 of 2 positions shown · non-contrast
Comparison: Chest radiograph performed 04/23/2011

CLINICAL DATA: Acute onset of mid chest tightness and shortness of
breath.

EXAM:
CHEST - 2 VIEW

[chest pa]
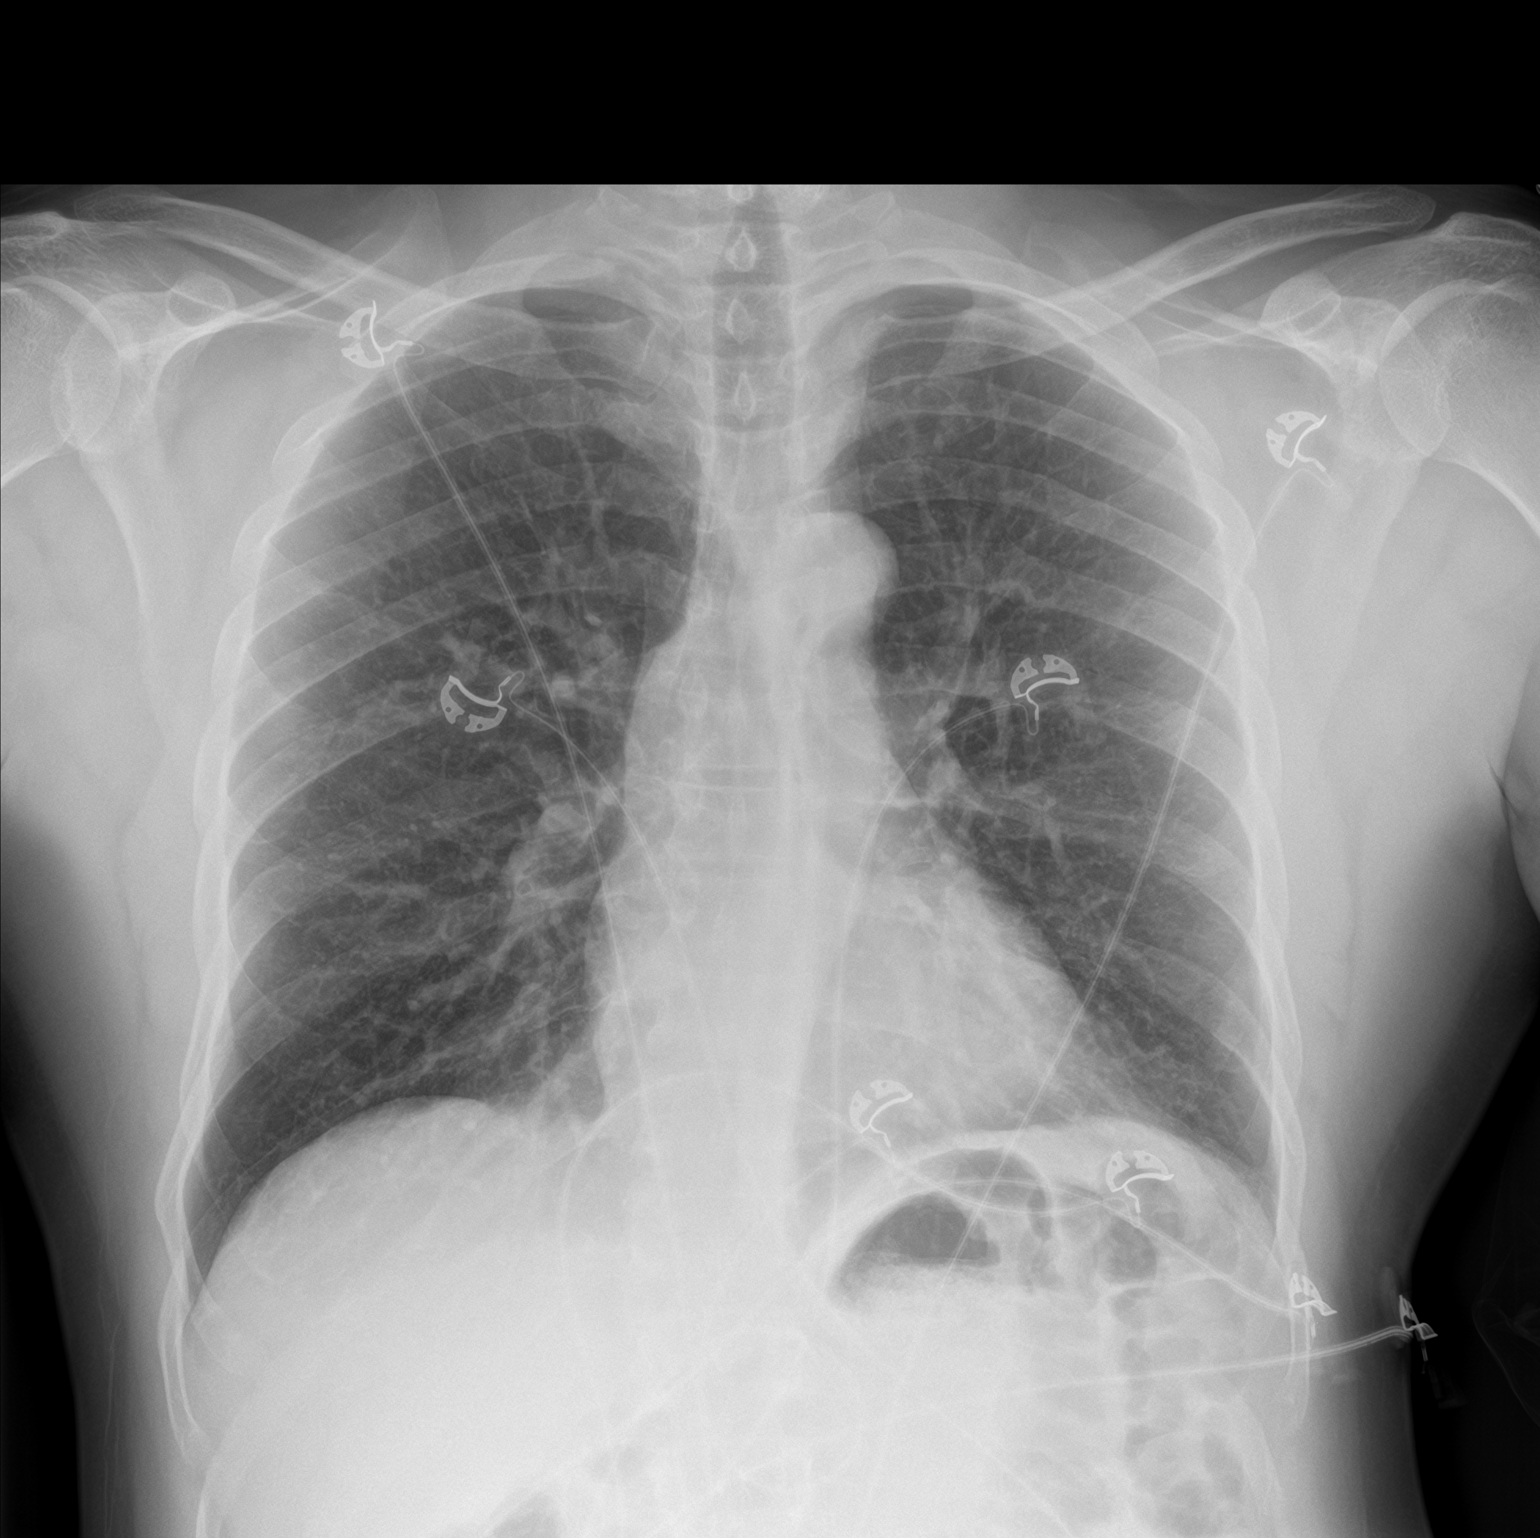

[chest lat]
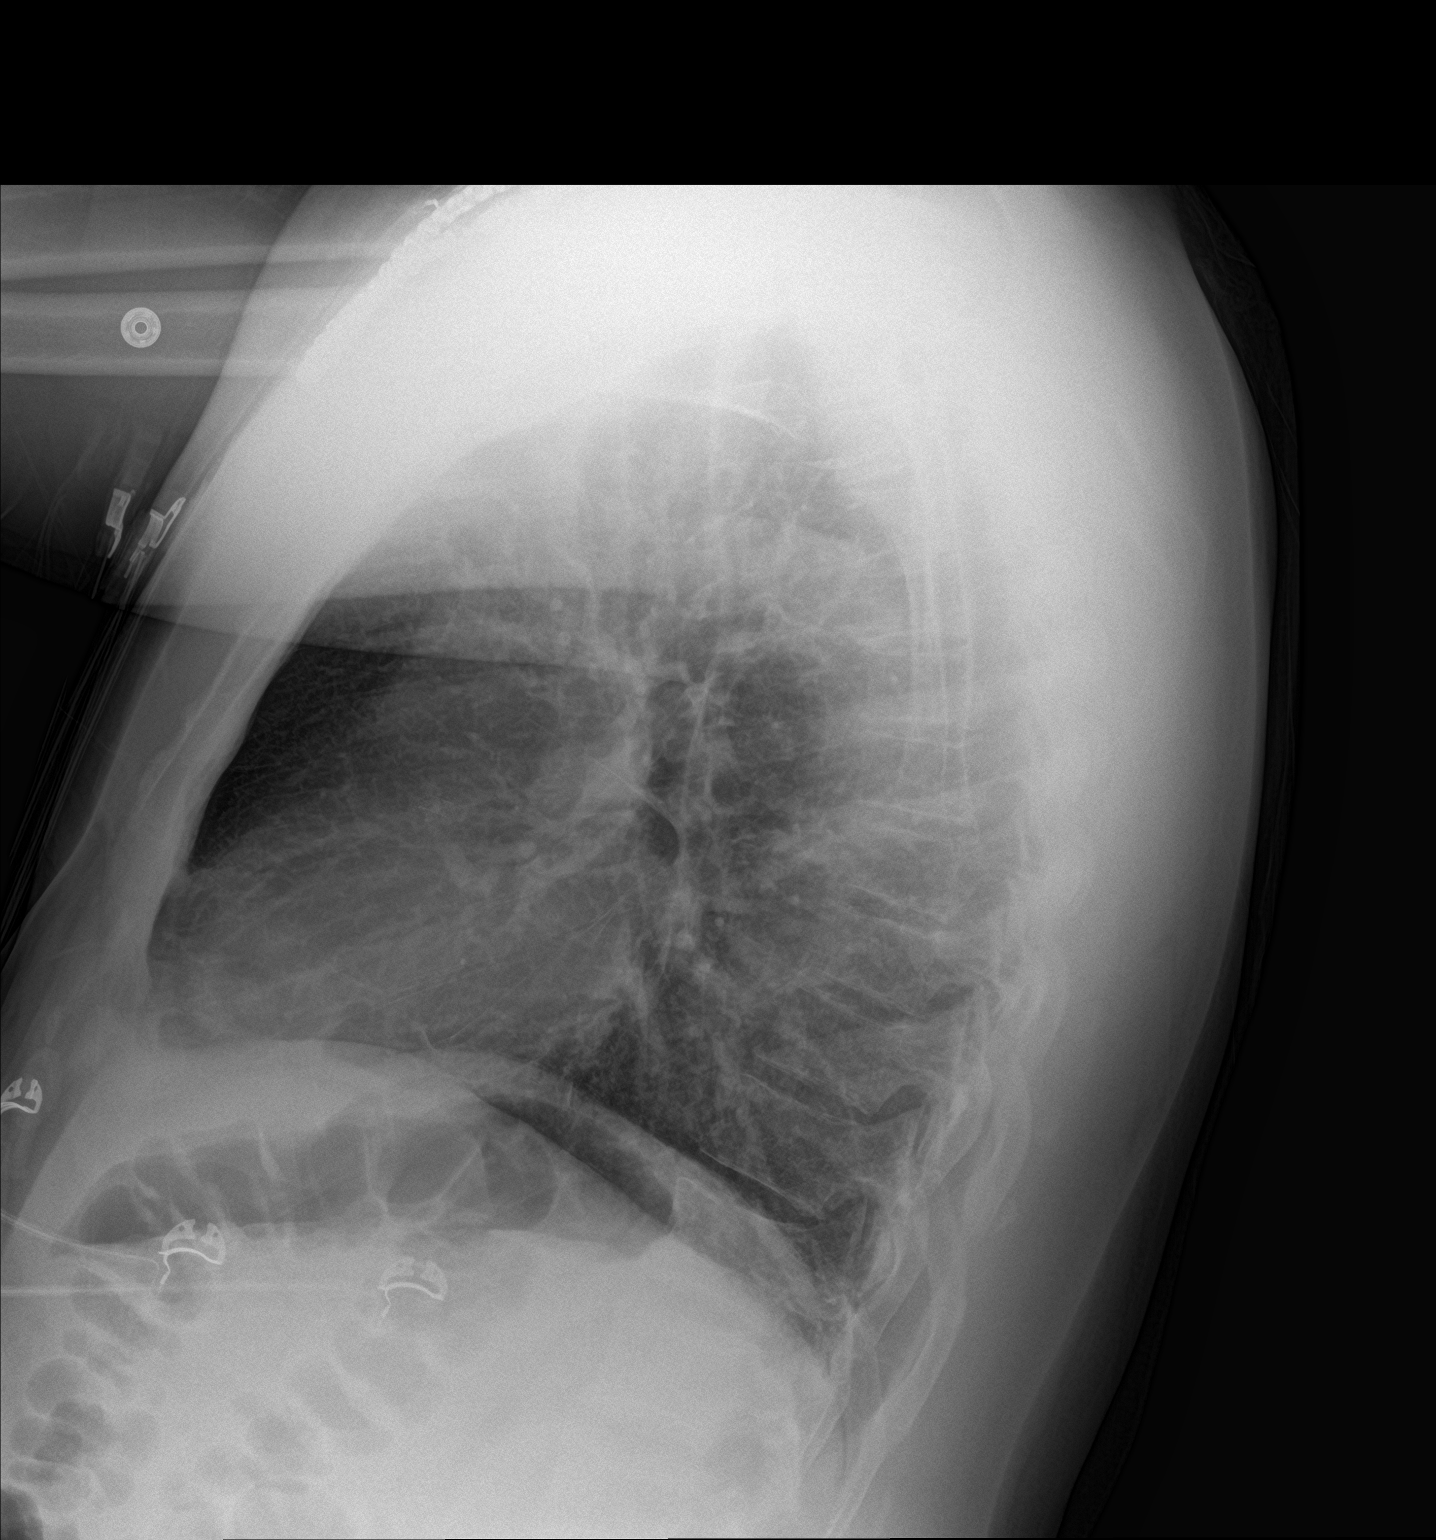

[2 of 2 positions shown; findings below may reference images not displayed]

FINDINGS: The lungs are well-aerated. Minimal left basilar opacity likely
reflects atelectasis. There is no evidence of pleural effusion or
pneumothorax.

The heart is normal in size; the mediastinal contour is within
normal limits. No acute osseous abnormalities are seen.
IMPRESSION: Minimal left basilar opacity likely reflects atelectasis.

## 2020-12-16 ENCOUNTER — Ambulatory Visit: Payer: BLUE CROSS/BLUE SHIELD | Admitting: Nurse Practitioner

## 2020-12-23 ENCOUNTER — Ambulatory Visit: Payer: Self-pay | Admitting: Nurse Practitioner

## 2021-07-28 ENCOUNTER — Encounter: Payer: Self-pay | Admitting: Emergency Medicine

## 2021-07-28 ENCOUNTER — Other Ambulatory Visit: Payer: Self-pay

## 2021-07-28 ENCOUNTER — Ambulatory Visit
Admission: EM | Admit: 2021-07-28 | Discharge: 2021-07-28 | Disposition: A | Discharge status: Home / Self Care | Payer: BC Managed Care – PPO

## 2021-07-28 DIAGNOSIS — H00015 Hordeolum externum left lower eyelid: Secondary | ICD-10-CM

## 2021-07-28 MED ORDER — ERYTHROMYCIN 5 MG/GM OP OINT: TOPICAL_OINTMENT | OPHTHALMIC | 0 refills | Status: DC

## 2021-07-28 NOTE — ED Provider Notes (Signed)
[facility]    CSN: [id number] Arrival date & time: 07/28/21  1924      History   Chief Complaint No chief complaint on file.   HPI Drew Hubbard is a 46 y.o. male.   Patient presenting today with progressively worsening left lower eyelid swelling, irritation, drainage.  Denies globe trauma, eye redness, itching, irritation, vision changes, headache, nausea, vomiting.  Has not been trying anything over-the-counter for symptoms.  No past history of ophthalmic issues.   Past Medical History:  Diagnosis Date   Myocardial infarct, old     There are no problems to display for this patient.   Past Surgical History:  Procedure Laterality Date   CARDIAC CATHETERIZATION         Home Medications    Prior to Admission medications   Medication Sig Start Date End Date Taking? Authorizing Provider  aspirin EC 325 MG tablet Take 325 mg by mouth daily.   Yes [provider]  erythromycin ophthalmic ointment Place a 1/2 inch ribbon of ointment into the left lower eyelid BID. 07/28/21  Yes Particia Nearing, PA-C  guaiFENesin-codeine [facility]) 100-10 MG/5ML syrup 5-10 ml q 4-6 hrs prn cough 04/23/11   Worthy Rancher, PA-C    Family History History reviewed. No pertinent family history.  Social History Social History   Tobacco Use   Smoking status: Some Days    Packs/day: 1.00    Types: Cigarettes   Smokeless tobacco: Never  Substance Use Topics   Alcohol use: Yes    Alcohol/week: 1.0 - 2.0 standard drink    Types: 1 - 2 Cans of beer per week    Comment: daily   Drug use: Yes    Types: Marijuana     Allergies   Patient has no known allergies.   Review of Systems Review of Systems Per HPI  Physical Exam Triage Vital Signs ED Triage Vitals  Enc Vitals Group     BP 07/28/21 1930 129/83     Pulse Rate 07/28/21 1930 85     Resp 07/28/21 1930 16     Temp 07/28/21 1930 97.8 F (36.6 C)     Temp Source 07/28/21 1930 Oral      SpO2 07/28/21 1930 96 %     Weight --      Height --      Head Circumference --      Peak Flow --      Pain Score 07/28/21 1931 5     Pain Loc --      Pain Edu? --      Excl. in GC? --    No data found.  Updated Vital Signs BP 129/83 (BP Location: Right Arm)    Pulse 85    Temp 97.8 F (36.6 C) (Oral)    Resp 16    SpO2 96%   Visual Acuity Right Eye Distance:   Left Eye Distance:   Bilateral Distance:    Right Eye Near:   Left Eye Near:    Bilateral Near:     Physical Exam Vitals and nursing note reviewed.  Constitutional:      Appearance: Normal appearance.  HENT:     Head: Atraumatic.  Eyes:     Extraocular Movements: Extraocular movements intact.     Pupils: Pupils are equal, round, and reactive to light.     Comments: Left eye benign, left lower eyelid diffusely erythematous and edematous extending about 0.5 cm below lash line,  stye present inner corner left lower eyelid with pustule.  Clear drainage present  Cardiovascular:     Rate and Rhythm: Normal rate and regular rhythm.  Pulmonary:     Effort: Pulmonary effort is normal.     Breath sounds: Normal breath sounds.  Musculoskeletal:        General: Normal range of motion.     Cervical back: Normal range of motion and neck supple.  Skin:    General: Skin is warm and dry.  Neurological:     General: No focal deficit present.     Mental Status: He is oriented to person, place, and time.  Psychiatric:        Mood and Affect: Mood normal.        Thought Content: Thought content normal.        Judgment: Judgment normal.     UC Treatments / Results  Labs (all labs ordered are listed, but only abnormal results are displayed) Labs Reviewed - No data to display  EKG   Radiology No results found.  Procedures Procedures (including critical care time)  Medications Ordered in UC Medications - No data to display  Initial Impression / Assessment and Plan / UC Course  I have reviewed the triage vital  signs and the nursing notes.  Pertinent labs & imaging results that were available during my care of the patient were reviewed by me and considered in my medical decision making (see chart for details).     Treat with warm compresses, erythromycin ointment.  Good handwashing reviewed.  Return for acutely worsening symptoms.  Final Clinical Impressions(s) / UC Diagnoses   Final diagnoses:  Hordeolum externum of left lower eyelid   Discharge Instructions   None    ED Prescriptions     Medication Sig Dispense Auth. Provider   erythromycin ophthalmic ointment Place a 1/2 inch ribbon of ointment into the left lower eyelid BID. 3.5 g Particia Nearing, PA-C      PDMP not reviewed this encounter.   Particia Nearing, [place] 07/28/21 1950

## 2021-07-28 NOTE — ED Triage Notes (Signed)
Left eye redness, draining and swelling since Tuesday

## 2022-08-17 ENCOUNTER — Other Ambulatory Visit: Payer: Self-pay

## 2022-08-17 ENCOUNTER — Encounter (HOSPITAL_COMMUNITY)
Admission: EM | Disposition: A | Discharge status: Home / Self Care | Payer: Self-pay | Source: Home / Self Care | Attending: Cardiovascular Disease

## 2022-08-17 ENCOUNTER — Inpatient Hospital Stay (HOSPITAL_COMMUNITY): Payer: BC Managed Care – PPO

## 2022-08-17 ENCOUNTER — Encounter (HOSPITAL_COMMUNITY): Payer: Self-pay | Admitting: *Deleted

## 2022-08-17 ENCOUNTER — Emergency Department (HOSPITAL_COMMUNITY): Payer: BC Managed Care – PPO

## 2022-08-17 ENCOUNTER — Inpatient Hospital Stay (HOSPITAL_COMMUNITY)
Admission: EM | Admit: 2022-08-17 | Discharge: 2022-08-19 | DRG: 322 | Disposition: A | Discharge status: Home / Self Care | Payer: BC Managed Care – PPO | Attending: Cardiovascular Disease | Admitting: Cardiovascular Disease

## 2022-08-17 DIAGNOSIS — Z79899 Other long term (current) drug therapy: Secondary | ICD-10-CM

## 2022-08-17 DIAGNOSIS — F1110 Opioid abuse, uncomplicated: Secondary | ICD-10-CM | POA: Diagnosis present

## 2022-08-17 DIAGNOSIS — R0789 Other chest pain: Secondary | ICD-10-CM | POA: Diagnosis not present

## 2022-08-17 DIAGNOSIS — Z91148 Patient's other noncompliance with medication regimen for other reason: Secondary | ICD-10-CM | POA: Diagnosis not present

## 2022-08-17 DIAGNOSIS — T82855A Stenosis of coronary artery stent, initial encounter: Secondary | ICD-10-CM | POA: Diagnosis present

## 2022-08-17 DIAGNOSIS — F17210 Nicotine dependence, cigarettes, uncomplicated: Secondary | ICD-10-CM | POA: Diagnosis present

## 2022-08-17 DIAGNOSIS — R079 Chest pain, unspecified: Secondary | ICD-10-CM

## 2022-08-17 DIAGNOSIS — I2510 Atherosclerotic heart disease of native coronary artery without angina pectoris: Secondary | ICD-10-CM

## 2022-08-17 DIAGNOSIS — E785 Hyperlipidemia, unspecified: Secondary | ICD-10-CM | POA: Diagnosis not present

## 2022-08-17 DIAGNOSIS — I252 Old myocardial infarction: Secondary | ICD-10-CM

## 2022-08-17 DIAGNOSIS — I2121 ST elevation (STEMI) myocardial infarction involving left circumflex coronary artery: Secondary | ICD-10-CM | POA: Diagnosis present

## 2022-08-17 DIAGNOSIS — F1990 Other psychoactive substance use, unspecified, uncomplicated: Secondary | ICD-10-CM | POA: Insufficient documentation

## 2022-08-17 DIAGNOSIS — Y831 Surgical operation with implant of artificial internal device as the cause of abnormal reaction of the patient, or of later complication, without mention of misadventure at the time of the procedure: Secondary | ICD-10-CM | POA: Diagnosis present

## 2022-08-17 DIAGNOSIS — R61 Generalized hyperhidrosis: Secondary | ICD-10-CM | POA: Diagnosis not present

## 2022-08-17 DIAGNOSIS — I213 ST elevation (STEMI) myocardial infarction of unspecified site: Principal | ICD-10-CM

## 2022-08-17 DIAGNOSIS — F1310 Sedative, hypnotic or anxiolytic abuse, uncomplicated: Secondary | ICD-10-CM | POA: Diagnosis not present

## 2022-08-17 DIAGNOSIS — Z7982 Long term (current) use of aspirin: Secondary | ICD-10-CM | POA: Diagnosis not present

## 2022-08-17 DIAGNOSIS — F1210 Cannabis abuse, uncomplicated: Secondary | ICD-10-CM | POA: Diagnosis not present

## 2022-08-17 DIAGNOSIS — F17200 Nicotine dependence, unspecified, uncomplicated: Secondary | ICD-10-CM | POA: Insufficient documentation

## 2022-08-17 DIAGNOSIS — F1410 Cocaine abuse, uncomplicated: Secondary | ICD-10-CM | POA: Diagnosis present

## 2022-08-17 DIAGNOSIS — F1510 Other stimulant abuse, uncomplicated: Secondary | ICD-10-CM | POA: Diagnosis present

## 2022-08-17 HISTORY — PX: CORONARY/GRAFT ACUTE MI REVASCULARIZATION: CATH118305

## 2022-08-17 HISTORY — PX: LEFT HEART CATH AND CORONARY ANGIOGRAPHY: CATH118249

## 2022-08-17 HISTORY — PX: INTRAVASCULAR ULTRASOUND/IVUS: CATH118244

## 2022-08-17 LAB — CBC
HCT: 41 % (ref 39.0–52.0)
Hemoglobin: 13.8 g/dL (ref 13.0–17.0)
MCH: 29.2 pg (ref 26.0–34.0)
MCHC: 33.7 g/dL (ref 30.0–36.0)
MCV: 86.9 fL (ref 80.0–100.0)
Platelets: 252 10*3/uL (ref 150–400)
RBC: 4.72 MIL/uL (ref 4.22–5.81)
RDW: 12.7 % (ref 11.5–15.5)
WBC: 7 10*3/uL (ref 4.0–10.5)
nRBC: 0 % (ref 0.0–0.2)

## 2022-08-17 LAB — COMPREHENSIVE METABOLIC PANEL
ALT: 30 U/L (ref 0–44)
AST: 34 U/L (ref 15–41)
Albumin: 4.4 g/dL (ref 3.5–5.0)
Alkaline Phosphatase: 70 U/L (ref 38–126)
Anion gap: 10 (ref 5–15)
BUN: 14 mg/dL (ref 6–20)
CO2: 25 mmol/L (ref 22–32)
Calcium: 9.2 mg/dL (ref 8.9–10.3)
Chloride: 99 mmol/L (ref 98–111)
Creatinine, Ser: 1.17 mg/dL (ref 0.61–1.24)
GFR, Estimated: >60 mL/min (ref >60)
Glucose, Bld: 90 mg/dL (ref 70–99)
Potassium: 3.9 mmol/L (ref 3.5–5.1)
Sodium: 134 mmol/L — ABNORMAL LOW (ref 135–145)
Total Bilirubin: 0.5 mg/dL (ref 0.3–1.2)
Total Protein: 7.8 g/dL (ref 6.5–8.1)

## 2022-08-17 LAB — LIPID PANEL
Cholesterol: 143 mg/dL (ref 0–200)
HDL: 60 mg/dL (ref >40)
LDL Cholesterol: 66 mg/dL (ref 0–99)
Total CHOL/HDL Ratio: 2.4 RATIO
Triglycerides: 87 mg/dL (ref <150)
VLDL: 17 mg/dL (ref 0–40)

## 2022-08-17 LAB — CBC WITH DIFFERENTIAL/PLATELET
Abs Immature Granulocytes: 0.02 10*3/uL (ref 0.00–0.07)
Basophils Absolute: 0 10*3/uL (ref 0.0–0.1)
Basophils Relative: 0 %
Eosinophils Absolute: 0.1 10*3/uL (ref 0.0–0.5)
Eosinophils Relative: 1 %
HCT: 43.2 % (ref 39.0–52.0)
Hemoglobin: 14.1 g/dL (ref 13.0–17.0)
Immature Granulocytes: 0 %
Lymphocytes Relative: 34 %
Lymphs Abs: 2.5 10*3/uL (ref 0.7–4.0)
MCH: 28.7 pg (ref 26.0–34.0)
MCHC: 32.6 g/dL (ref 30.0–36.0)
MCV: 88 fL (ref 80.0–100.0)
Monocytes Absolute: 0.7 10*3/uL (ref 0.1–1.0)
Monocytes Relative: 10 %
Neutro Abs: 4 10*3/uL (ref 1.7–7.7)
Neutrophils Relative %: 55 %
Platelets: 283 10*3/uL (ref 150–400)
RBC: 4.91 MIL/uL (ref 4.22–5.81)
RDW: 12.6 % (ref 11.5–15.5)
WBC: 7.3 10*3/uL (ref 4.0–10.5)
nRBC: 0 % (ref 0.0–0.2)

## 2022-08-17 LAB — APTT: aPTT: 28 seconds (ref 24–36)

## 2022-08-17 LAB — POCT ACTIVATED CLOTTING TIME
Activated Clotting Time: 250 seconds
Activated Clotting Time: 255 seconds

## 2022-08-17 LAB — HEMOGLOBIN A1C
Hgb A1c MFr Bld: 5.5 % (ref 4.8–5.6)
Mean Plasma Glucose: 111.15 mg/dL

## 2022-08-17 LAB — RAPID URINE DRUG SCREEN, HOSP PERFORMED
Amphetamines: POSITIVE — AB
Barbiturates: NOT DETECTED
Benzodiazepines: POSITIVE — AB
Cocaine: POSITIVE — AB
Opiates: POSITIVE — AB
Tetrahydrocannabinol: POSITIVE — AB

## 2022-08-17 LAB — PROTIME-INR
INR: 1 (ref 0.8–1.2)
Prothrombin Time: 12.8 seconds (ref 11.4–15.2)

## 2022-08-17 LAB — ECHOCARDIOGRAM COMPLETE
AR max vel: 4.2 cm2
AV Area VTI: 3.82 cm2
AV Area mean vel: 3.81 cm2
AV Mean grad: 4 mmHg
AV Peak grad: 6.2 mmHg
Ao pk vel: 1.24 m/s
Area-P 1/2: 3.43 cm2
Height: 69 in
S' Lateral: 3.9 cm
Weight: 3200 oz

## 2022-08-17 LAB — MRSA NEXT GEN BY PCR, NASAL: MRSA by PCR Next Gen: NOT DETECTED

## 2022-08-17 LAB — TROPONIN I (HIGH SENSITIVITY)
Troponin I (High Sensitivity): 480 ng/L (ref <18)
Troponin I (High Sensitivity): 10279 ng/L (ref <18)

## 2022-08-17 LAB — CREATININE, SERUM
Creatinine, Ser: 1.21 mg/dL (ref 0.61–1.24)
GFR, Estimated: >60 mL/min (ref >60)

## 2022-08-17 LAB — HIV ANTIBODY (ROUTINE TESTING W REFLEX): HIV Screen 4th Generation wRfx: NONREACTIVE

## 2022-08-17 SURGERY — LEFT HEART CATH AND CORONARY ANGIOGRAPHY
Anesthesia: LOCAL

## 2022-08-17 MED ORDER — SODIUM CHLORIDE 0.9% FLUSH: 3.0000 mL | INTRAVENOUS | Status: DC | PRN

## 2022-08-17 MED ORDER — NITROGLYCERIN 0.4 MG SL SUBL: 0.4000 mg | SUBLINGUAL_TABLET | SUBLINGUAL | Status: DC | PRN

## 2022-08-17 MED ORDER — LABETALOL HCL 5 MG/ML IV SOLN: 10.0000 mg | INTRAVENOUS | Status: AC | PRN

## 2022-08-17 MED ORDER — TICAGRELOR 90 MG PO TABS
90.0000 mg | ORAL_TABLET | Freq: Two times a day (BID) | ORAL | Status: DC
  Administered 2022-08-17 – 2022-08-19 (×4): 90 mg via ORAL
  Filled 2022-08-17 (×4): qty 1

## 2022-08-17 MED ORDER — ASPIRIN 81 MG PO CHEW: 324.0000 mg | CHEWABLE_TABLET | ORAL | Status: AC

## 2022-08-17 MED ORDER — HEPARIN (PORCINE) IN NACL 1000-0.9 UT/500ML-% IV SOLN
INTRAVENOUS | Status: AC
  Filled 2022-08-17: qty 500

## 2022-08-17 MED ORDER — TIROFIBAN HCL IN NACL 5-0.9 MG/100ML-% IV SOLN
INTRAVENOUS | Status: DC | PRN
  Administered 2022-08-17: .15 ug/kg/min via INTRAVENOUS

## 2022-08-17 MED ORDER — TIROFIBAN HCL IN NACL 5-0.9 MG/100ML-% IV SOLN: 0.1500 ug/kg/min | INTRAVENOUS | Status: DC

## 2022-08-17 MED ORDER — HEPARIN SODIUM (PORCINE) 1000 UNIT/ML IJ SOLN
INTRAMUSCULAR | Status: AC
  Filled 2022-08-17: qty 10

## 2022-08-17 MED ORDER — FENTANYL CITRATE (PF) 100 MCG/2ML IJ SOLN
INTRAMUSCULAR | Status: AC
  Filled 2022-08-17: qty 2

## 2022-08-17 MED ORDER — HEPARIN SODIUM (PORCINE) 5000 UNIT/ML IJ SOLN
4000.0000 [IU] | Freq: Once | INTRAMUSCULAR | Status: AC
  Administered 2022-08-17: 4000 [IU] via INTRAVENOUS
  Filled 2022-08-17: qty 1

## 2022-08-17 MED ORDER — SODIUM CHLORIDE 0.9 % IV SOLN: INTRAVENOUS | Status: DC

## 2022-08-17 MED ORDER — TIROFIBAN HCL IN NACL 5-0.9 MG/100ML-% IV SOLN
INTRAVENOUS | Status: AC
  Filled 2022-08-17: qty 100

## 2022-08-17 MED ORDER — MIDAZOLAM HCL 2 MG/2ML IJ SOLN
INTRAMUSCULAR | Status: DC | PRN
  Administered 2022-08-17: 2 mg via INTRAVENOUS
  Administered 2022-08-17: 1 mg via INTRAVENOUS

## 2022-08-17 MED ORDER — FENTANYL CITRATE (PF) 100 MCG/2ML IJ SOLN
INTRAMUSCULAR | Status: DC | PRN
  Administered 2022-08-17 (×2): 25 ug via INTRAVENOUS

## 2022-08-17 MED ORDER — HEPARIN (PORCINE) 25000 UT/250ML-% IV SOLN
1100.0000 [IU]/h | INTRAVENOUS | Status: DC
  Administered 2022-08-17: 1100 [IU]/h via INTRAVENOUS
  Filled 2022-08-17: qty 250

## 2022-08-17 MED ORDER — NITROGLYCERIN 0.4 MG SL SUBL
SUBLINGUAL_TABLET | SUBLINGUAL | Status: AC
  Administered 2022-08-17: 0.4 mg via SUBLINGUAL
  Filled 2022-08-17: qty 1

## 2022-08-17 MED ORDER — HEPARIN (PORCINE) IN NACL 1000-0.9 UT/500ML-% IV SOLN
INTRAVENOUS | Status: DC | PRN
  Administered 2022-08-17 (×2): 500 mL

## 2022-08-17 MED ORDER — MELATONIN 3 MG PO TABS
3.0000 mg | ORAL_TABLET | Freq: Every evening | ORAL | Status: DC | PRN
  Administered 2022-08-17 – 2022-08-18 (×2): 3 mg via ORAL
  Filled 2022-08-17 (×2): qty 1

## 2022-08-17 MED ORDER — ASPIRIN 81 MG PO CHEW
CHEWABLE_TABLET | ORAL | Status: AC
  Administered 2022-08-17: 324 mg via ORAL
  Filled 2022-08-17: qty 4

## 2022-08-17 MED ORDER — ONDANSETRON HCL 4 MG/2ML IJ SOLN: 4.0000 mg | Freq: Four times a day (QID) | INTRAMUSCULAR | Status: DC | PRN

## 2022-08-17 MED ORDER — LIDOCAINE HCL (PF) 1 % IJ SOLN
INTRAMUSCULAR | Status: DC | PRN
  Administered 2022-08-17: 2 mL

## 2022-08-17 MED ORDER — ENOXAPARIN SODIUM 40 MG/0.4ML IJ SOSY
40.0000 mg | PREFILLED_SYRINGE | INTRAMUSCULAR | Status: DC
  Administered 2022-08-18: 40 mg via SUBCUTANEOUS
  Filled 2022-08-17 (×2): qty 0.4

## 2022-08-17 MED ORDER — TICAGRELOR 90 MG PO TABS
ORAL_TABLET | ORAL | Status: DC | PRN
  Administered 2022-08-17: 180 mg via ORAL

## 2022-08-17 MED ORDER — VERAPAMIL HCL 2.5 MG/ML IV SOLN
INTRAVENOUS | Status: AC
  Filled 2022-08-17: qty 2

## 2022-08-17 MED ORDER — CHLORHEXIDINE GLUCONATE CLOTH 2 % EX PADS
6.0000 | MEDICATED_PAD | Freq: Every day | CUTANEOUS | Status: DC
  Administered 2022-08-18: 6 via TOPICAL

## 2022-08-17 MED ORDER — SODIUM CHLORIDE 0.9% FLUSH
3.0000 mL | Freq: Two times a day (BID) | INTRAVENOUS | Status: DC
  Administered 2022-08-17 – 2022-08-19 (×4): 3 mL via INTRAVENOUS

## 2022-08-17 MED ORDER — TICAGRELOR 90 MG PO TABS
ORAL_TABLET | ORAL | Status: AC
  Filled 2022-08-17: qty 1

## 2022-08-17 MED ORDER — MIDAZOLAM HCL 2 MG/2ML IJ SOLN
INTRAMUSCULAR | Status: AC
  Filled 2022-08-17: qty 2

## 2022-08-17 MED ORDER — SODIUM CHLORIDE 0.9 % IV SOLN: 250.0000 mL | INTRAVENOUS | Status: DC | PRN

## 2022-08-17 MED ORDER — VERAPAMIL HCL 2.5 MG/ML IV SOLN
INTRAVENOUS | Status: DC | PRN
  Administered 2022-08-17: 10 mL via INTRA_ARTERIAL

## 2022-08-17 MED ORDER — HEPARIN SODIUM (PORCINE) 1000 UNIT/ML IJ SOLN
INTRAMUSCULAR | Status: DC | PRN
  Administered 2022-08-17: 3000 [IU] via INTRAVENOUS
  Administered 2022-08-17: 5000 [IU] via INTRAVENOUS

## 2022-08-17 MED ORDER — NITROGLYCERIN 0.4 MG SL SUBL
0.4000 mg | SUBLINGUAL_TABLET | SUBLINGUAL | Status: DC | PRN
  Administered 2022-08-17: 0.4 mg via SUBLINGUAL

## 2022-08-17 MED ORDER — LIDOCAINE HCL (PF) 1 % IJ SOLN
INTRAMUSCULAR | Status: AC
  Filled 2022-08-17: qty 30

## 2022-08-17 MED ORDER — TIROFIBAN (AGGRASTAT) BOLUS VIA INFUSION
INTRAVENOUS | Status: DC | PRN
  Administered 2022-08-17: 2267.5 ug via INTRAVENOUS

## 2022-08-17 MED ORDER — ATORVASTATIN CALCIUM 80 MG PO TABS
80.0000 mg | ORAL_TABLET | Freq: Every day | ORAL | Status: DC
  Administered 2022-08-17 – 2022-08-19 (×3): 80 mg via ORAL
  Filled 2022-08-17 (×3): qty 1

## 2022-08-17 MED ORDER — HYDRALAZINE HCL 20 MG/ML IJ SOLN: 10.0000 mg | INTRAMUSCULAR | Status: AC | PRN

## 2022-08-17 MED ORDER — SODIUM CHLORIDE 0.9 % IV SOLN: INTRAVENOUS | Status: AC

## 2022-08-17 MED ORDER — IOHEXOL 350 MG/ML SOLN
INTRAVENOUS | Status: DC | PRN
  Administered 2022-08-17: 85 mL

## 2022-08-17 MED ORDER — ASPIRIN 81 MG PO TBEC
81.0000 mg | DELAYED_RELEASE_TABLET | Freq: Every day | ORAL | Status: DC
  Administered 2022-08-18 – 2022-08-19 (×2): 81 mg via ORAL
  Filled 2022-08-17 (×2): qty 1

## 2022-08-17 MED ORDER — ACETAMINOPHEN 325 MG PO TABS
650.0000 mg | ORAL_TABLET | ORAL | Status: DC | PRN
  Filled 2022-08-17: qty 2

## 2022-08-17 SURGICAL SUPPLY — 17 items
CATH 5FR JL3.5 JR4 ANG PIG MP (CATHETERS) IMPLANT
CATH LAUNCHER 6FR EBU3.5 (CATHETERS) IMPLANT
CATH OPTICROSS HD (CATHETERS) IMPLANT
DEVICE RAD COMP TR BAND LRG (VASCULAR PRODUCTS) IMPLANT
ELECT DEFIB PAD ADLT CADENCE (PAD) IMPLANT
GLIDESHEATH SLEND SS 6F .021 (SHEATH) IMPLANT
GUIDEWIRE INQWIRE 1.5J.035X260 (WIRE) IMPLANT
INQWIRE 1.5J .035X260CM (WIRE) ×1
KIT ENCORE 26 ADVANTAGE (KITS) IMPLANT
KIT HEART LEFT (KITS) ×1 IMPLANT
KIT MICROPUNCTURE NIT STIFF (SHEATH) IMPLANT
PACK CARDIAC CATHETERIZATION (CUSTOM PROCEDURE TRAY) ×1 IMPLANT
SLED PULL BACK IVUS (MISCELLANEOUS) IMPLANT
STENT MEGATRON 4.5X20 (Permanent Stent) IMPLANT
TRANSDUCER W/STOPCOCK (MISCELLANEOUS) ×1 IMPLANT
TUBING CIL FLEX 10 FLL-RA (TUBING) ×1 IMPLANT
WIRE COUGAR XT STRL 190CM (WIRE) IMPLANT

## 2022-08-17 NOTE — ED Notes (Signed)
Pt left with ems, RN to ride due to heparin drip

## 2022-08-17 NOTE — ED Notes (Signed)
Called CODE STEMI @ 0840 called [facility] for STEMI doctor @ [phone].  Called [facility] for transport @ 0845 to [facility].

## 2022-08-17 NOTE — ED Triage Notes (Signed)
Pt c/o mid sternal chest pain that started at 4am while watching TV; pt describes the pain as a tightness and is c/o some sob  Pt states the pain is worse when he takes a deep breath

## 2022-08-17 NOTE — ED Notes (Signed)
Nitro SL given per verbal order.

## 2022-08-17 NOTE — H&P (Addendum)
Cardiology Admission History and Physical   Patient ID: Drew Hubbard MRN: [id number]; DOB: Aug 25, 1975   Admission date: 08/17/2022  PCP:  Noreene Larsson, NP   [facility] Cardiologist:  [facility] (Dr. Burt Knack)  Chief Complaint:  chest pain / STEMI  Patient Profile:   Drew Hubbard is a 47 y.o. male with a history of CAD with a MI in 12/2007 (age 61) s/p PTCA stent to LAD and polysubstance use (tobacco, marijuana, cocaine use) who presented for sudden onset chest pain and was found to have an inferolateral STEMI.  History of Present Illness:   [name] is a 47 year old male with the above history.  He was admitted with a NSTEMI in 11/2007 and LHC at that time showed 3% stenosis of the proximal to mid LAD with otherwise no CAD. LVEF was 60%. Medical therapy was recommended.  He was readmitted in 12/2007 with an MI. Urine drug screen was positive for cocaine and THC at that time. I am unable to see the full cardiac catheterization report from that time but per discharge summary LHC showed 52% stenosis with ruptured flap of the LAD which was treated with PTCA stent.   Patient presented to the [facility] ED today further evaluation of chest pain.  He was in his usual state of health until around 4 AM this morning when he developed substernal chest pain that he described as a pressure/burning sensation.  Per ED note, he stated it was worse when laying down flat. No associated shortness of breath, nausea, vomiting, diaphoresis. EKG showed normal sinus rhythm with ST elevations in all inferior leads as well as leads V4-V6 and ST depression in lead aVL.  Code STEMI was called.  Started on an IV heparin drip and given aspirin 324 mg as well as 3 doses of sublingual Nitro and then transferred to [facility] for emergent cardiac catheterization. Upon arrival to [facility], he was still having 5/10 chest pain but did not look to be uncomfortable or in any distress.   It does not sound  like he has had very consistent medical care since the time of his PCI.  He cannot tell us the last time he was seen by medical provider.  He does not take any medications regularly.  He states he takes Aspirin every now and then.  He continues to smoke cigarettes.  He also endorses some marijuana use but denies any other drug use.  He does have a history of cocaine use.  Labs/ Imaging from [facility]: High-sensitivity troponin 480. Chest x-ray showed no acute findings. WBC 7.3, Hgb 14.1, Plts 283. Na 134, K 3.9, Glucose 90, BUN 14, Cr 1.17. LFTs normal.  Past Medical History:  Diagnosis Date   Myocardial infarct, old     Past Surgical History:  Procedure Laterality Date   CARDIAC CATHETERIZATION       Medications Prior to Admission: Prior to Admission medications   Medication Sig Start Date End Date Taking? Authorizing Provider  aspirin EC 325 MG tablet Take 325 mg by mouth daily.    [provider]  erythromycin ophthalmic ointment Place a 1/2 inch ribbon of ointment into the left lower eyelid BID. 07/28/21   Volney American, PA-C  guaiFENesin-codeine [facility]) 100-10 MG/5ML syrup 5-10 ml q 4-6 hrs prn cough 04/23/11   Duaine Dredge, PA-C     Allergies:   No Known Allergies  Social History:   Social History   Socioeconomic History  Marital status: Married    Spouse name: Not on file   Number of children: Not on file   Years of education: Not on file   Highest education level: Not on file  Occupational History   Not on file  Tobacco Use   Smoking status: Some Days    Packs/day: 1.00    Types: Cigarettes   Smokeless tobacco: Never  Substance and Sexual Activity   Alcohol use: Yes    Alcohol/week: 1.0 - 2.0 standard drink of alcohol    Types: 1 - 2 Cans of beer per week    Comment: daily   Drug use: Yes    Types: Marijuana   Sexual activity: Not on file  Other Topics Concern   Not on file  Social History Narrative   Not on file   Social  Determinants of Health   Financial Resource Strain: Not on file  Food Insecurity: Not on file  Transportation Needs: Not on file  Physical Activity: Not on file  Stress: Not on file  Social Connections: Not on file  Intimate Partner Violence: Not on file    Family History:   The patient's family history is not on file.  Unable to obtain family history due to need for emergent cardiac catheterization.  ROS:  Please see the history of present illness.  Unable to obtain full ROS due to need for emergent cardiac catheterization in setting of STEMI.   Physical Exam/Data:   Vitals:   08/17/22 0839 08/17/22 0850 08/17/22 0852 08/17/22 0947  BP: (!) 154/108 (!) 156/95 (!) 127/97   Pulse: 87 (!) 104 87   Resp: 12 (!) 25 16   Temp: 97.8 F (36.6 C)     TempSrc: Oral     SpO2: 100% 93% 96% 99%  Weight:      Height:       No intake or output data in the 24 hours ending 08/17/22 1005    08/17/2022    8:35 AM 07/08/2018    3:20 AM  Last 3 Weights  Weight (lbs) 200 lb 185 lb  Weight (kg) 90.719 kg 83.915 kg     Body mass index is 29.53 kg/m.   Physical Exam per MD:  General: 47 y.o. African-American male resting comfortably in no acute distress. HEENT: Normocephalic and atraumatic. Sclera clear. EOMs intact. Neck: Supple. No JVD. Heart: RRR. Distinct S1 and S2. No murmurs, gallops, or rubs. Radial and distal pedal pulses 2+ and equal bilaterally. Lungs: No increased work of breathing. Clear to ausculation bilaterally. No wheezes, rhonchi, or rales.  Abdomen: Soft, non-distended, and non-tender to palpation.  MSK: Normal strength and tone for age. Extremities: No lower extremity edema.    Skin: Warm and dry. Neuro: Alert and oriented x3. No focal deficits. Psych: Normal affect. Responds appropriately.  EKG:  The ECG that was done was personally reviewed and demonstrates normal sinus rhythm with ST elevations in all inferior leads as well as leads V4-V6 and ST depression in  lead aVL  Relevant CV Studies:  Cardiac Catheterization 11/17/2007: - CORONARY ANATOMY:  The left main coronary artery was unremarkable. - LEFT ANTERIOR DESCENDING CORONARY ARTERY:  The left anterior descending  coronary artery showed proximal-to-mid one-third ejection and 40%  concentric lesion with a slow filling distally. - LEFT CIRCUMFLEX CORONARY ARTERY:  The left circumflex coronary artery  was unremarkable. - RIGHT CORONARY ARTERY:  The right coronary artery was also unremarkable. - LEFT VENTRICULOGRAM:  The left ventriculogram showed a 60%  ejection  fraction by echocardiogram.    IMPRESSION:  1. One-vessel coronary artery disease.  2. Preserved left ventricular systolic function.    RECOMMENDATIONS:  This patient will be treated medically with lifestyle  modification.  Laboratory Data:  High Sensitivity Troponin:   Recent Labs  Lab 08/17/22 0840  TROPONINIHS 480*      Chemistry Recent Labs  Lab 08/17/22 0840  NA 134*  K 3.9  CL 99  CO2 25  GLUCOSE 90  BUN 14  CREATININE 1.17  CALCIUM 9.2  GFRNONAA >60  ANIONGAP 10    Recent Labs  Lab 08/17/22 0840  PROT 7.8  ALBUMIN 4.4  AST 34  ALT 30  ALKPHOS 70  BILITOT 0.5   Lipids  Recent Labs  Lab 08/17/22 0840  CHOL 143  TRIG 87  HDL 60  LDLCALC 66  CHOLHDL 2.4   Hematology Recent Labs  Lab 08/17/22 0840  WBC 7.3  RBC 4.91  HGB 14.1  HCT 43.2  MCV 88.0  MCH 28.7  MCHC 32.6  RDW 12.6  PLT 283   Thyroid No results for input(s): "TSH", "FREET4" in the last 168 hours. BNPNo results for input(s): "BNP", "PROBNP" in the last 168 hours.  DDimer No results for input(s): "DDIMER" in the last 168 hours.   Radiology/Studies:  [facility] Chest Port 1 View  Result Date: 08/17/2022 CLINICAL DATA:  Chest pain. EXAM: PORTABLE CHEST 1 VIEW COMPARISON:  07/08/2018 FINDINGS: The lungs are clear without focal pneumonia, edema, pneumothorax or pleural effusion. The cardiopericardial silhouette is within normal  limits for size. The visualized bony structures of the thorax are unremarkable. Telemetry leads overlie the chest. IMPRESSION: No active disease. Electronically Signed   By: Misty Stanley M.D.   On: 08/17/2022 08:58     Assessment and Plan:   STEMI CAD Patient has a history of CAD with prior MI s/p PTCA stent to LAD in 12/2007. He was 47 years old at the time and UDS was positive for cocaine and THC. He has had inconsistent medical care since then and does not take any medications regularly. He presented today to [facility] with sudden onset of chest pain. EKG showed ormal sinus rhythm with ST elevations in all inferior leads as well as leads V4-V6 and ST depression in lead aVL. Code STEMI was called and he was transferred for emergent cardiac catheterization. Initial troponin elevated at 480.  - Still having 5/10 chest pain on arrival to cath lab. - Will continue to trend troponin. - Will check Echo. - Will start aspirin and statin. Will hold off on beta-blocker for now until after cath given history of cocaine use. - Further recommendation pending cath results.  Polysubstance Abuse He has a history of tobacco, marijuana, and cocaine use. He denies any current cocaine use but does report continued tobacco and marijuana use. - Will check urine drug screen.   Risk Assessment/Risk Scores:   TIMI Risk Score for ST  Elevation MI:   The patient's TIMI risk score is 4, which indicates a 7.3% risk of all cause mortality at 30 days.{    Severity of Illness: The appropriate patient status for this patient is INPATIENT. Inpatient status is judged to be reasonable and necessary in order to provide the required intensity of service to ensure the patient's safety. The patient's presenting symptoms, physical exam findings, and initial radiographic and laboratory data in the context of their chronic comorbidities is felt to place them at high risk for further clinical  deterioration. Furthermore, it is not  anticipated that the patient will be medically stable for discharge from the hospital within 2 midnights of admission.   * I certify that at the point of admission it is my clinical judgment that the patient will require inpatient hospital care spanning beyond 2 midnights from the point of admission due to high intensity of service, high risk for further deterioration and high frequency of surveillance required.*   For questions or updates, please contact [facility] Please consult [email or web address] for contact info under     Signed, Corrin Parker, PA-C  08/17/2022 10:05 AM   Patient seen, examined. Available data reviewed. Agree with findings, assessment, and plan as outlined by Marjie Skiff, PA-C.  The patient is interviewed on his arrival to the cardiac catheterization lab.  He developed onset of chest discomfort early this morning.  His EKG was positive for an inferolateral STEMI.  A code STEMI is called and he is transferred directly to cardiac Cath Lab.  On arrival, he continues to have 5/5 chest pain.  He is alert, oriented, no distress.  Lungs are clear, heart is regular rate and rhythm no murmur gallop, carotid upstrokes normal with no bruits, JVP is normal, abdomen is soft and nontender, extremities have no edema, skin is warm and dry with no rash.  Will proceed emergently with cardiac catheterization and primary PCI.  Emergency implied consent is obtained.  I explained the procedure and potential risks to the patient.  He understands.  Further plans/disposition pending his cardiac catheterization results.  The patient admits to alcohol, tobacco, and marijuana use.  Tonny Bollman, M.D. 08/17/2022 2:45 PM

## 2022-08-17 NOTE — ED Provider Notes (Signed)
[facility] Provider Note   CSN: [id number] Arrival date & time: 08/17/22  [id number]     History  Chief Complaint  Patient presents with   Chest Pain    Drew Hubbard is a 47 y.o. male.  47 year old male with a history of CAD status post PCI who presents to the emergency department with chest discomfort.  Describes it as substernal chest discomfort that started this morning at 0400.  Has difficulty characterizing it.  Currently rates it 6/10 in severity.  Says that it is worsened with laying down.  Unclear if walking changes the pain.  Has not tried nitroglycerin.  No diaphoresis or vomiting.  Does take aspirin after he had a heart attack and stents placed previously but has not taken yet this morning.  Not on sildenafil or any other medications for erectile dysfunction.  Not on blood thinners.  Denies any recent illnesses, fevers, cough, shortness of breath recently.  No lower extremity swelling.       Home Medications Prior to Admission medications   Medication Sig Start Date End Date Taking? Authorizing Provider  aspirin EC 325 MG tablet Take 325 mg by mouth daily.    [provider]  erythromycin ophthalmic ointment Place a 1/2 inch ribbon of ointment into the left lower eyelid BID. 07/28/21   Volney American, PA-C  guaiFENesin-codeine [facility]) 100-10 MG/5ML syrup 5-10 ml q 4-6 hrs prn cough 04/23/11   Duaine Dredge, PA-C      Allergies    Patient has no known allergies.    Review of Systems   Review of Systems  Physical Exam Updated Vital Signs BP (!) 154/108 (BP Location: Left Arm)   Pulse 87   Temp 97.8 F (36.6 C) (Oral)   Resp 12   Ht 5\' 9"  (1.753 m)   Wt 90.7 kg   SpO2 100%   BMI 29.53 kg/m  Physical Exam Vitals and nursing note reviewed.  Constitutional:      General: He is not in acute distress.    Appearance: He is well-developed. He is not ill-appearing.     Comments: Alert and  conversant.  Not toxic appearing.  HENT:     Head: Normocephalic and atraumatic.     Right Ear: External ear normal.     Left Ear: External ear normal.     Nose: Nose normal.  Eyes:     Extraocular Movements: Extraocular movements intact.     Conjunctiva/sclera: Conjunctivae normal.     Pupils: Pupils are equal, round, and reactive to light.  Cardiovascular:     Rate and Rhythm: Normal rate and regular rhythm.     Heart sounds: Normal heart sounds.  Pulmonary:     Effort: Pulmonary effort is normal. No respiratory distress.     Breath sounds: Normal breath sounds.  Musculoskeletal:     Cervical back: Normal range of motion and neck supple.     Right lower leg: No edema.     Left lower leg: No edema.  Skin:    General: Skin is warm and dry.  Neurological:     Mental Status: He is alert. Mental status is at baseline.  Psychiatric:        Mood and Affect: Mood normal.        Behavior: Behavior normal.     ED Results / Procedures / Treatments   Labs (all labs ordered are listed, but only abnormal results are displayed) Labs  Reviewed  HEMOGLOBIN A1C  CBC WITH DIFFERENTIAL/PLATELET  PROTIME-INR  APTT  COMPREHENSIVE METABOLIC PANEL  LIPID PANEL  TROPONIN I (HIGH SENSITIVITY)    EKG EKG Interpretation  Date/Time:  Friday August 17 2022 08:36:46 EST Ventricular Rate:  93 PR Interval:  163 QRS Duration: 83 QT Interval:  360 QTC Calculation: 448 R Axis:   87 Text Interpretation: Sinus rhythm Inferior infarct, acute (LCx) ST elevation, consider anterior injury Lateral leads are also involved >>> Acute MI <<< Confirmed by Margaretmary Eddy [id number]) on 08/17/2022 8:39:08 AM  Radiology No results found.  Procedures Procedures   Medications Ordered in ED Medications  nitroGLYCERIN (NITROSTAT) 0.4 MG SL tablet (has no administration in time range)  0.9 %  sodium chloride infusion (has no administration in time range)  heparin injection 4,000 Units (has no administration  in time range)  aspirin chewable tablet 324 mg (has no administration in time range)    ED Course/ Medical Decision Making/ A&P Clinical Course as of 08/17/22 0916  Fri Aug 17, 2022  [id number] Dr Burt Knack from cardiology has accepted the patient to the Cath Lab at [facility] for STEMI treatment.  [RP]  [id number] EMS has arrived and will take the patient to [facility]. [RP]    Clinical Course User Index [RP] Fransico Meadow, MD                            Medical Decision Making Amount and/or Complexity of Data Reviewed Labs: ordered. Radiology: ordered.  Risk OTC drugs. Prescription drug management. Decision regarding hospitalization.   Drew Hubbard is a 47 y.o. male with comorbidities that complicate the patient evaluation including CAD status post PCI presents emergency department with chest pain  Initial Ddx:  MI, pericarditis, myocarditis, PE, pneumonia, URI  MDM:  Initially on history was unclear what was causing the patient's chest discomfort.  However EKG was obtained shortly afterwards which did show ST elevations and inferior and anterolateral leads with T wave inversion in aVL that is concerning for STEMI.  Cath Lab was activated.  Also considered pericarditis given the fact that his pain is positional but with the elevations and inversion in aVL will treat as an MI at this time.  No infectious symptoms to suggest pneumonia or URI.  No significant shortness of breath or hypoxia that would suggest PE.  Plan:  Labs Troponin EKG Chest x-ray Cath Lab activation Aspirin Heparin  ED Summary/Re-evaluation:  Patient reassessed and was having worsening symptoms.  Was given 3 doses of sublingual nitroglycerin with persistence of his pain.  Was given aspirin 324 mg in the ED.  ZOLL pads were placed on the patient in case he goes into an arrhythmia.  Was started on a heparin drip and EMS arrived to take the patient to [facility] for heart catheterization.  Labs pending at the time  of transfer.  This patient presents to the ED for concern of complaints listed in HPI, this involves an extensive number of treatment options, and is a complaint that carries with it a high risk of complications and morbidity. Disposition including potential need for admission considered.   Dispo: Cath Lab  Records reviewed Outpatient Clinic Notes The following labs were independently interpreted: CBC and show no acute abnormality I independently reviewed the following imaging with scope of interpretation limited to determining acute life threatening conditions related to emergency care: Chest x-ray and agree with the radiologist interpretation with the following  exceptions: none I personally reviewed and interpreted cardiac monitoring: normal sinus rhythm  I personally reviewed and interpreted the pt's EKG: see above for interpretation  I have reviewed the patients home medications and made adjustments as needed Consults: Cardiology  Final Clinical Impression(s) / ED Diagnoses Final diagnoses:  ST elevation myocardial infarction (STEMI), unspecified artery ([place])    Rx / DC Orders ED Discharge Orders     None      CRITICAL CARE Performed by: Fransico Meadow   Total critical care time: 30 minutes  Critical care time was exclusive of separately billable procedures and treating other patients.  Critical care was necessary to treat or prevent imminent or life-threatening deterioration.  Critical care was time spent personally by me on the following activities: development of treatment plan with patient and/or surrogate as well as nursing, discussions with consultants, evaluation of patient's response to treatment, examination of patient, obtaining history from patient or surrogate, ordering and performing treatments and interventions, ordering and review of laboratory studies, ordering and review of radiographic studies, pulse oximetry and re-evaluation of patient's condition.     Fransico Meadow, MD 08/17/22 [phone]

## 2022-08-17 NOTE — Progress Notes (Signed)
ANTICOAGULATION CONSULT NOTE - Initial Consult  Pharmacy Consult for Heparin Indication: chest pain/ACS  No Known Allergies  Patient Measurements: Height: 5\' 9"  (175.3 cm) Weight: 90.7 kg (200 lb) IBW/kg (Calculated) : 70.7 HEPARIN DW (KG): 89.1   Vital Signs: Temp: 97.8 F (36.6 C) (02/02 0839) Temp Source: Oral (02/02 0839) BP: 127/97 (02/02 0852) Pulse Rate: 87 (02/02 0852)  Labs: No results for input(s): "HGB", "HCT", "PLT", "APTT", "LABPROT", "INR", "HEPARINUNFRC", "HEPRLOWMOCWT", "CREATININE", "CKTOTAL", "CKMB", "TROPONINIHS" in the last 72 hours.  CrCl cannot be calculated (Patient's most recent lab result is older than the maximum 21 days allowed.).   Medical History: Past Medical History:  Diagnosis Date   Myocardial infarct, old     Medications:  See med rec  Assessment: Patient presented to ED with c/o mid sternal chest pain that started at 4am while watching TV; pt describes the pain as a tightness and is c/o some sob . Not on oral anticoagulants. Pharmacy asked to start heparin. Heparin 4000 unit bolus already given.  Goal of Therapy:  Heparin level 0.3-0.7 units/ml Monitor platelets by anticoagulation protocol: Yes   Plan:  Start heparin infusion at 1100 units/hr Check anti-Xa level in 6-8 hours and daily while on heparin Continue to monitor H&H and platelets  Isac Sarna, BS Pharm D, BCPS Clinical Pharmacist 08/17/2022,8:55 AM

## 2022-08-17 NOTE — Progress Notes (Signed)
   08/17/22 1000  Spiritual Encounters  Type of Visit Initial  Care provided to: Patient  Conversation partners present during encounter Nurse  Referral source Code page  Reason for visit Code  OnCall Visit No   Ch responded to code STEMI. ED nurse said that Pt is going straight to Cath Lab. No family present. No follow up needed at this time.

## 2022-08-18 DIAGNOSIS — I2121 ST elevation (STEMI) myocardial infarction involving left circumflex coronary artery: Principal | ICD-10-CM

## 2022-08-18 LAB — CBC
HCT: 40.5 % (ref 39.0–52.0)
Hemoglobin: 13.7 g/dL (ref 13.0–17.0)
MCH: 29.3 pg (ref 26.0–34.0)
MCHC: 33.8 g/dL (ref 30.0–36.0)
MCV: 86.7 fL (ref 80.0–100.0)
Platelets: 240 10*3/uL (ref 150–400)
RBC: 4.67 MIL/uL (ref 4.22–5.81)
RDW: 12.9 % (ref 11.5–15.5)
WBC: 6.6 10*3/uL (ref 4.0–10.5)
nRBC: 0 % (ref 0.0–0.2)

## 2022-08-18 LAB — BASIC METABOLIC PANEL
Anion gap: 10 (ref 5–15)
BUN: 7 mg/dL (ref 6–20)
CO2: 20 mmol/L — ABNORMAL LOW (ref 22–32)
Calcium: 8.9 mg/dL (ref 8.9–10.3)
Chloride: 108 mmol/L (ref 98–111)
Creatinine, Ser: 1.2 mg/dL (ref 0.61–1.24)
GFR, Estimated: >60 mL/min (ref >60)
Glucose, Bld: 123 mg/dL — ABNORMAL HIGH (ref 70–99)
Potassium: 3.9 mmol/L (ref 3.5–5.1)
Sodium: 138 mmol/L (ref 135–145)

## 2022-08-18 NOTE — Progress Notes (Signed)
CARDIAC REHAB PHASE I   Pt declined to walk at this time. Stated that he just got back from a walk shortly ago. Education provided to patient, topics reviewed were giving and reviewing the MI Book, risk factors, exercise guidelines, angina and nitro use, stent placement and post cath restrictions, nutrition and smoking cessation. Pt was eager to learn and asked questions. RN answered all questions and reviewed all topics. Pt is interested in Cardiac Rehab Phase 2 at [facility]. Referral will be placed. We will continue to follow along with [facility].   [id number] Janine Ores, RN, BSN 08/18/2022 [phone]

## 2022-08-18 NOTE — Progress Notes (Signed)
Rounding Note    Patient Name: Drew Hubbard Date of Encounter: 08/18/2022  [facility] Cardiologist: None  Subjective   No chest pain or sob.   Inpatient Medications    Scheduled Meds:  aspirin EC  81 mg Oral Daily   atorvastatin  80 mg Oral Daily   Chlorhexidine Gluconate Cloth  6 each Topical Daily   enoxaparin (LOVENOX) injection  40 mg Subcutaneous Q24H   sodium chloride flush  3 mL Intravenous Q12H   ticagrelor  90 mg Oral BID   Continuous Infusions:  sodium chloride 999 mL/hr at 08/17/22 0913   sodium chloride     PRN Meds: sodium chloride, acetaminophen, melatonin, nitroGLYCERIN, nitroGLYCERIN, ondansetron (ZOFRAN) IV, sodium chloride flush   Vital Signs    Vitals:   08/18/22 0700 08/18/22 0800 08/18/22 0900 08/18/22 1000  BP: 121/79 (!) 127/93 100/83 (!) 127/92  Pulse: 81 83  81  Resp: (!) 8 18 12 13   Temp:  98.2 F (36.8 C)    TempSrc:  Oral    SpO2: 97% 98%  95%  Weight:      Height:        Intake/Output Summary (Last 24 hours) at 08/18/2022 1012 Last data filed at 08/18/2022 0850 Gross per 24 hour  Intake 133 ml  Output 1250 ml  Net -1117 ml      08/17/2022    8:35 AM 07/08/2018    3:20 AM  Last 3 Weights  Weight (lbs) 200 lb 185 lb  Weight (kg) 90.719 kg 83.915 kg      Telemetry    nsr - Personally Reviewed  ECG    nsr - Personally Reviewed  Physical Exam   GEN: No acute distress.   Neck: No JVD Cardiac: RRR, no murmurs, rubs, or gallops.  Respiratory: Clear to auscultation bilaterally. GI: Soft, nontender, non-distended  MS: No edema; No deformity. Neuro:  Nonfocal  Psych: Normal affect   Labs    High Sensitivity Troponin:   Recent Labs  Lab 08/17/22 0840 08/17/22 1444  TROPONINIHS 480* 10,279*     Chemistry Recent Labs  Lab 08/17/22 0840 08/17/22 1444 08/18/22 0417  NA 134*  --  138  K 3.9  --  3.9  CL 99  --  108  CO2 25  --  20*  GLUCOSE 90  --  123*  BUN 14  --  7  CREATININE 1.17 1.21 1.20   CALCIUM 9.2  --  8.9  PROT 7.8  --   --   ALBUMIN 4.4  --   --   AST 34  --   --   ALT 30  --   --   ALKPHOS 70  --   --   BILITOT 0.5  --   --   GFRNONAA >60 >60 >60  ANIONGAP 10  --  10    Lipids  Recent Labs  Lab 08/17/22 0840  CHOL 143  TRIG 87  HDL 60  LDLCALC 66  CHOLHDL 2.4    Hematology Recent Labs  Lab 08/17/22 0840 08/17/22 1444 08/18/22 0417  WBC 7.3 7.0 6.6  RBC 4.91 4.72 4.67  HGB 14.1 13.8 13.7  HCT 43.2 41.0 40.5  MCV 88.0 86.9 86.7  MCH 28.7 29.2 29.3  MCHC 32.6 33.7 33.8  RDW 12.6 12.7 12.9  PLT 283 252 240   Thyroid No results for input(s): "TSH", "FREET4" in the last 168 hours.  BNPNo results for input(s): "BNP", "PROBNP" in the  last 168 hours.  DDimer No results for input(s): "DDIMER" in the last 168 hours.   Radiology    ECHOCARDIOGRAM COMPLETE  Result Date: 08/17/2022    ECHOCARDIOGRAM REPORT   Patient Name:   Drew Hubbard Date of Exam: 08/17/2022 Medical Rec #:  [id number]       Height:       69.0 in Accession #:    [id number]      Weight:       200.0 lb Date of Birth:  Aug 05, 1975       BSA:          2.066 m Patient Age:    47 years        BP:           122/72 mmHg Patient Gender: M               HR:           75 bpm. Exam Location:  Inpatient Procedure: 2D Echo, Cardiac Doppler and Color Doppler Indications:    R07.9* Chest pain, unspecified  History:        Patient has no prior history of Echocardiogram examinations. CAD                 and Previous Myocardial Infarction; Signs/Symptoms:Chest Pain.  Sonographer:    Wilkie Aye RVT RCS Referring Phys: [place]  1. Left ventricular ejection fraction, by estimation, is 55 to 60%. The left ventricle has normal function. The left ventricle has no regional wall motion abnormalities. There is mild left ventricular hypertrophy. Left ventricular diastolic parameters were normal.  2. Right ventricular systolic function is normal. The right ventricular size is normal.  3. The mitral  valve is normal in structure. Trivial mitral valve regurgitation. No evidence of mitral stenosis.  4. The aortic valve is tricuspid. Aortic valve regurgitation is not visualized. No aortic stenosis is present.  5. There is mild dilatation of the aortic root, measuring 38 mm.  6. The inferior vena cava is normal in size with greater than 50% respiratory variability, suggesting right atrial pressure of 3 mmHg. FINDINGS  Left Ventricle: Left ventricular ejection fraction, by estimation, is 55 to 60%. The left ventricle has normal function. The left ventricle has no regional wall motion abnormalities. The left ventricular internal cavity size was normal in size. There is  mild left ventricular hypertrophy. Left ventricular diastolic parameters were normal. Right Ventricle: The right ventricular size is normal. No increase in right ventricular wall thickness. Right ventricular systolic function is normal. Left Atrium: Left atrial size was normal in size. Right Atrium: Right atrial size was normal in size. Pericardium: There is no evidence of pericardial effusion. Mitral Valve: The mitral valve is normal in structure. Trivial mitral valve regurgitation. No evidence of mitral valve stenosis. Tricuspid Valve: The tricuspid valve is normal in structure. Tricuspid valve regurgitation is mild . No evidence of tricuspid stenosis. Aortic Valve: The aortic valve is tricuspid. Aortic valve regurgitation is not visualized. No aortic stenosis is present. Aortic valve mean gradient measures 4.0 mmHg. Aortic valve peak gradient measures 6.2 mmHg. Aortic valve area, by VTI measures 3.82 cm. Pulmonic Valve: The pulmonic valve was normal in structure. Pulmonic valve regurgitation is not visualized. No evidence of pulmonic stenosis. Aorta: The aortic root is normal in size and structure. There is mild dilatation of the aortic root, measuring 38 mm. Venous: The inferior vena cava is normal in size with greater than 50% respiratory  variability, suggesting right atrial pressure of 3 mmHg. IAS/Shunts: No atrial level shunt detected by color flow Doppler.  LEFT VENTRICLE PLAX 2D LVIDd:         5.30 cm   Diastology LVIDs:         3.90 cm   LV e' medial:    10.40 cm/s LV PW:         0.90 cm   LV E/e' medial:  8.4 LV IVS:        1.20 cm   LV e' lateral:   10.40 cm/s LVOT diam:     2.20 cm   LV E/e' lateral: 8.4 LV SV:         84 LV SV Index:   41 LVOT Area:     3.80 cm  RIGHT VENTRICLE             IVC RV Basal diam:  3.30 cm     IVC diam: 1.60 cm RV Mid diam:    2.10 cm RV S prime:     14.90 cm/s TAPSE (M-mode): 2.3 cm LEFT ATRIUM             Index        RIGHT ATRIUM           Index LA diam:        3.40 cm 1.65 cm/m   RA Area:     14.80 cm LA Vol (A2C):   57.7 ml 27.93 ml/m  RA Volume:   38.60 ml  18.68 ml/m LA Vol (A4C):   32.9 ml 15.92 ml/m LA Biplane Vol: 43.4 ml 21.01 ml/m  AORTIC VALVE                    PULMONIC VALVE AV Area (Vmax):    4.20 cm     PV Vmax:       0.88 m/s AV Area (Vmean):   3.81 cm     PV Peak grad:  3.1 mmHg AV Area (VTI):     3.82 cm AV Vmax:           124.00 cm/s AV Vmean:          88.100 cm/s AV VTI:            0.221 m AV Peak Grad:      6.2 mmHg AV Mean Grad:      4.0 mmHg LVOT Vmax:         137.00 cm/s LVOT Vmean:        88.300 cm/s LVOT VTI:          0.222 m LVOT/AV VTI ratio: 1.00  AORTA Ao Root diam: 3.80 cm MITRAL VALVE MV Area (PHT): 3.43 cm    SHUNTS MV Decel Time: 221 msec    Systemic VTI:  0.22 m MV E velocity: 87.30 cm/s  Systemic Diam: 2.20 cm MV A velocity: 71.20 cm/s MV E/A ratio:  1.23 Charlton Haws MD Electronically signed by Charlton Haws MD Signature Date/Time: 08/17/2022/2:47:45 PM    Final    CARDIAC CATHETERIZATION  Result Date: 08/17/2022   Mid Cx lesion is 75% stenosed.   Prox LAD to Mid LAD lesion is 50% stenosed.   A drug-eluting stent was successfully placed using a STENT MEGATRON 4.5X20.   Post intervention, there is a 0% residual stenosis.   LV end diastolic pressure is normal.   The  left ventricular ejection fraction is 45-50% by visual estimate. 1.  Widely patent left main  2.  Widely patent and dominant RCA 3.  Patent LAD with mild to moderate 50% in-stent restenosis in the mid vessel stent 4.  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 Mild segmental LV dysfunction with anterolateral hypokinesis and LVEF estimated at 45-50% Recommendations: Check 2D echo, post MI medical care, continue tirofiban x 2 hours, DAPT with aspirin and ticagrelor x 12 months, lifestyle modification.  If patient clinically stable, he could be discharged tomorrow.   DG Chest Port 1 View  Result Date: 08/17/2022 CLINICAL DATA:  Chest pain. EXAM: PORTABLE CHEST 1 VIEW COMPARISON:  07/08/2018 FINDINGS: The lungs are clear without focal pneumonia, edema, pneumothorax or pleural effusion. The cardiopericardial silhouette is within normal limits for size. The visualized bony structures of the thorax are unremarkable. Telemetry leads overlie the chest. IMPRESSION: No active disease. Electronically Signed   By: Misty Stanley M.D.   On: 08/17/2022 08:58    Cardiac Studies   See above  Patient Profile     47 y.o. male admitted with LCX STEMI, s/p PCI  Assessment & Plan    STEMI LCX - he is doing well s/p PCI. He will be transferred to tele. Continue meds.  Tobacco abuse - I encouraged the patient to stop smoking.   For questions or updates, please contact [place] Please consult [email or web address] for contact info under     Signed, Cristopher Peru, MD  08/18/2022, 10:12 AM

## 2022-08-19 DIAGNOSIS — I2121 ST elevation (STEMI) myocardial infarction involving left circumflex coronary artery: Secondary | ICD-10-CM | POA: Diagnosis not present

## 2022-08-19 DIAGNOSIS — F1990 Other psychoactive substance use, unspecified, uncomplicated: Secondary | ICD-10-CM | POA: Insufficient documentation

## 2022-08-19 DIAGNOSIS — E785 Hyperlipidemia, unspecified: Secondary | ICD-10-CM | POA: Insufficient documentation

## 2022-08-19 DIAGNOSIS — F17200 Nicotine dependence, unspecified, uncomplicated: Secondary | ICD-10-CM | POA: Insufficient documentation

## 2022-08-19 LAB — CBC
HCT: 38.9 % — ABNORMAL LOW (ref 39.0–52.0)
Hemoglobin: 12.9 g/dL — ABNORMAL LOW (ref 13.0–17.0)
MCH: 28.9 pg (ref 26.0–34.0)
MCHC: 33.2 g/dL (ref 30.0–36.0)
MCV: 87.2 fL (ref 80.0–100.0)
Platelets: 228 10*3/uL (ref 150–400)
RBC: 4.46 MIL/uL (ref 4.22–5.81)
RDW: 12.8 % (ref 11.5–15.5)
WBC: 6.6 10*3/uL (ref 4.0–10.5)
nRBC: 0 % (ref 0.0–0.2)

## 2022-08-19 MED ORDER — TICAGRELOR 90 MG PO TABS: 90.0000 mg | ORAL_TABLET | Freq: Two times a day (BID) | ORAL | 3 refills | Status: AC

## 2022-08-19 MED ORDER — ATORVASTATIN CALCIUM 80 MG PO TABS: 80.0000 mg | ORAL_TABLET | Freq: Every day | ORAL | 3 refills | Status: AC

## 2022-08-19 MED ORDER — ASPIRIN 81 MG PO TBEC: 81.0000 mg | DELAYED_RELEASE_TABLET | Freq: Every day | ORAL | 3 refills | Status: AC

## 2022-08-19 MED ORDER — NITROGLYCERIN 0.4 MG SL SUBL: 0.4000 mg | SUBLINGUAL_TABLET | SUBLINGUAL | 3 refills | Status: AC | PRN

## 2022-08-19 NOTE — Progress Notes (Signed)
Approximately 1300-- Pt discharged to home. All discharge instructions provided to pt and wife (at bedside), including printed AVS. Pt verbalized understanding of discharge instructions and teach-back method utilized. All questions answered at this time. All PIVs removed. All patient belongings taken with pt--pt verified. At time of discharge, pt A&O x 4, no complaints of pain, and VSS. Telemetry notified.

## 2022-08-19 NOTE — Progress Notes (Signed)
Rounding Note    Patient Name: Drew Hubbard Date of Encounter: 08/19/2022  [place] Cardiologist: None  Subjective   No chest pain or sob. Echo showed normal LVEF.  Inpatient Medications    Scheduled Meds:  aspirin EC  81 mg Oral Daily   atorvastatin  80 mg Oral Daily   Chlorhexidine Gluconate Cloth  6 each Topical Daily   enoxaparin (LOVENOX) injection  40 mg Subcutaneous Q24H   sodium chloride flush  3 mL Intravenous Q12H   ticagrelor  90 mg Oral BID   Continuous Infusions:  sodium chloride Stopped (08/19/22 0701)   sodium chloride     PRN Meds: sodium chloride, acetaminophen, melatonin, nitroGLYCERIN, nitroGLYCERIN, ondansetron (ZOFRAN) IV, sodium chloride flush   Vital Signs    Vitals:   08/19/22 0044 08/19/22 0045 08/19/22 0451 08/19/22 0741  BP: 123/75  135/83 (!) 124/97  Pulse: 80 76 73 78  Resp: 15  15 18   Temp: 97.8 F (36.6 C)  98.6 F (37 C) 98 F (36.7 C)  TempSrc: Oral  Oral Oral  SpO2: 99% 100% 100% 99%  Weight:   87.1 kg   Height:        Intake/Output Summary (Last 24 hours) at 08/19/2022 1034 Last data filed at 08/19/2022 7169 Gross per 24 hour  Intake 717 ml  Output 0 ml  Net 717 ml      08/19/2022    4:51 AM 08/18/2022    4:05 PM 08/17/2022    8:35 AM  Last 3 Weights  Weight (lbs) 192 lb 188 lb 8 oz 200 lb  Weight (kg) 87.091 kg 85.503 kg 90.719 kg      Telemetry    nsr - Personally Reviewed  ECG    nsr - Personally Reviewed  Physical Exam   GEN: No acute distress.   Neck: No JVD Cardiac: RRR, no murmurs, rubs, or gallops.  Respiratory: Clear to auscultation bilaterally. GI: Soft, nontender, non-distended  MS: No edema; No deformity. Neuro:  Nonfocal  Psych: Normal affect   Labs    High Sensitivity Troponin:   Recent Labs  Lab 08/17/22 0840 08/17/22 1444  TROPONINIHS 480* 10,279*     Chemistry Recent Labs  Lab 08/17/22 0840 08/17/22 1444 08/18/22 0417  NA 134*  --  138  K 3.9  --  3.9  CL 99   --  108  CO2 25  --  20*  GLUCOSE 90  --  123*  BUN 14  --  7  CREATININE 1.17 1.21 1.20  CALCIUM 9.2  --  8.9  PROT 7.8  --   --   ALBUMIN 4.4  --   --   AST 34  --   --   ALT 30  --   --   ALKPHOS 70  --   --   BILITOT 0.5  --   --   GFRNONAA >60 >60 >60  ANIONGAP 10  --  10    Lipids  Recent Labs  Lab 08/17/22 0840  CHOL 143  TRIG 87  HDL 60  LDLCALC 66  CHOLHDL 2.4    Hematology Recent Labs  Lab 08/17/22 1444 08/18/22 0417 08/19/22 0036  WBC 7.0 6.6 6.6  RBC 4.72 4.67 4.46  HGB 13.8 13.7 12.9*  HCT 41.0 40.5 38.9*  MCV 86.9 86.7 87.2  MCH 29.2 29.3 28.9  MCHC 33.7 33.8 33.2  RDW 12.7 12.9 12.8  PLT 252 240 228   Thyroid No results  for input(s): "TSH", "FREET4" in the last 168 hours.  BNPNo results for input(s): "BNP", "PROBNP" in the last 168 hours.  DDimer No results for input(s): "DDIMER" in the last 168 hours.   Radiology    ECHOCARDIOGRAM COMPLETE  Result Date: 08/17/2022    ECHOCARDIOGRAM REPORT   Patient Name:   Drew Hubbard Date of Exam: 08/17/2022 Medical Rec #:  [id number]       Height:       69.0 in Accession #:    [id number]      Weight:       200.0 lb Date of Birth:  14-Apr-1976       BSA:          2.066 m Patient Age:    46 years        BP:           122/72 mmHg Patient Gender: M               HR:           75 bpm. Exam Location:  Inpatient Procedure: 2D Echo, Cardiac Doppler and Color Doppler Indications:    R07.9* Chest pain, unspecified  History:        Patient has no prior history of Echocardiogram examinations. CAD                 and Previous Myocardial Infarction; Signs/Symptoms:Chest Pain.  Sonographer:    Dondra Prader RVT RCS Referring Phys: [phone] MICHAEL COOPER IMPRESSIONS  1. Left ventricular ejection fraction, by estimation, is 55 to 60%. The left ventricle has normal function. The left ventricle has no regional wall motion abnormalities. There is mild left ventricular hypertrophy. Left ventricular diastolic parameters were normal.  2. Right  ventricular systolic function is normal. The right ventricular size is normal.  3. The mitral valve is normal in structure. Trivial mitral valve regurgitation. No evidence of mitral stenosis.  4. The aortic valve is tricuspid. Aortic valve regurgitation is not visualized. No aortic stenosis is present.  5. There is mild dilatation of the aortic root, measuring 38 mm.  6. The inferior vena cava is normal in size with greater than 50% respiratory variability, suggesting right atrial pressure of 3 mmHg. FINDINGS  Left Ventricle: Left ventricular ejection fraction, by estimation, is 55 to 60%. The left ventricle has normal function. The left ventricle has no regional wall motion abnormalities. The left ventricular internal cavity size was normal in size. There is  mild left ventricular hypertrophy. Left ventricular diastolic parameters were normal. Right Ventricle: The right ventricular size is normal. No increase in right ventricular wall thickness. Right ventricular systolic function is normal. Left Atrium: Left atrial size was normal in size. Right Atrium: Right atrial size was normal in size. Pericardium: There is no evidence of pericardial effusion. Mitral Valve: The mitral valve is normal in structure. Trivial mitral valve regurgitation. No evidence of mitral valve stenosis. Tricuspid Valve: The tricuspid valve is normal in structure. Tricuspid valve regurgitation is mild . No evidence of tricuspid stenosis. Aortic Valve: The aortic valve is tricuspid. Aortic valve regurgitation is not visualized. No aortic stenosis is present. Aortic valve mean gradient measures 4.0 mmHg. Aortic valve peak gradient measures 6.2 mmHg. Aortic valve area, by VTI measures 3.82 cm. Pulmonic Valve: The pulmonic valve was normal in structure. Pulmonic valve regurgitation is not visualized. No evidence of pulmonic stenosis. Aorta: The aortic root is normal in size and structure. There is mild dilatation of the aortic root,  measuring 38  mm. Venous: The inferior vena cava is normal in size with greater than 50% respiratory variability, suggesting right atrial pressure of 3 mmHg. IAS/Shunts: No atrial level shunt detected by color flow Doppler.  LEFT VENTRICLE PLAX 2D LVIDd:         5.30 cm   Diastology LVIDs:         3.90 cm   LV e' medial:    10.40 cm/s LV PW:         0.90 cm   LV E/e' medial:  8.4 LV IVS:        1.20 cm   LV e' lateral:   10.40 cm/s LVOT diam:     2.20 cm   LV E/e' lateral: 8.4 LV SV:         84 LV SV Index:   41 LVOT Area:     3.80 cm  RIGHT VENTRICLE             IVC RV Basal diam:  3.30 cm     IVC diam: 1.60 cm RV Mid diam:    2.10 cm RV S prime:     14.90 cm/s TAPSE (M-mode): 2.3 cm LEFT ATRIUM             Index        RIGHT ATRIUM           Index LA diam:        3.40 cm 1.65 cm/m   RA Area:     14.80 cm LA Vol (A2C):   57.7 ml 27.93 ml/m  RA Volume:   38.60 ml  18.68 ml/m LA Vol (A4C):   32.9 ml 15.92 ml/m LA Biplane Vol: 43.4 ml 21.01 ml/m  AORTIC VALVE                    PULMONIC VALVE AV Area (Vmax):    4.20 cm     PV Vmax:       0.88 m/s AV Area (Vmean):   3.81 cm     PV Peak grad:  3.1 mmHg AV Area (VTI):     3.82 cm AV Vmax:           124.00 cm/s AV Vmean:          88.100 cm/s AV VTI:            0.221 m AV Peak Grad:      6.2 mmHg AV Mean Grad:      4.0 mmHg LVOT Vmax:         137.00 cm/s LVOT Vmean:        88.300 cm/s LVOT VTI:          0.222 m LVOT/AV VTI ratio: 1.00  AORTA Ao Root diam: 3.80 cm MITRAL VALVE MV Area (PHT): 3.43 cm    SHUNTS MV Decel Time: 221 msec    Systemic VTI:  0.22 m MV E velocity: 87.30 cm/s  Systemic Diam: 2.20 cm MV A velocity: 71.20 cm/s MV E/A ratio:  1.23 Jenkins Rouge MD Electronically signed by Jenkins Rouge MD Signature Date/Time: 08/17/2022/2:47:45 PM    Final     Cardiac Studies   See above  Patient Profile     47 y.o. male admitted with LCX STEMI, s/p PCI  Assessment & Plan    STEMI LCX - he is doing well s/p PCI. No further chest pain. On ASA and Brilinta along with  atorvastatin (goal LDL <70). Stressed importance of  compliance with DAPT. No BB with recent cocaine use. Tobacco/polysubstance abuse -encouraged cessation Referral to [facility] cardiac rehab has been placed.  [place] for d/c home today.  For questions or updates, please contact [place] Please consult [email or web address] for contact info under   Pixie Casino, MD, FACC, [place] Director of the [facility] Diplomate of the American Board of Clinical Lipidology Attending Cardiologist  Direct Dial: [phone]  Fax: [phone]  Website:  [email or web address]  Pixie Casino, MD  08/19/2022, 10:34 AM

## 2022-08-19 NOTE — Plan of Care (Signed)
  Problem: Education: Goal: Knowledge of General Education information will improve Description: Including pain rating scale, medication(s)/side effects and non-pharmacologic comfort measures Outcome: Adequate for Discharge   Problem: Health Behavior/Discharge Planning: Goal: Ability to manage health-related needs will improve Outcome: Adequate for Discharge   Problem: Clinical Measurements: Goal: Ability to maintain clinical measurements within normal limits will improve Outcome: Adequate for Discharge Goal: Will remain free from infection Outcome: Adequate for Discharge Goal: Diagnostic test results will improve Outcome: Adequate for Discharge Goal: Respiratory complications will improve Outcome: Adequate for Discharge Goal: Cardiovascular complication will be avoided Outcome: Adequate for Discharge   Problem: Activity: Goal: Risk for activity intolerance will decrease Outcome: Adequate for Discharge   Problem: Nutrition: Goal: Adequate nutrition will be maintained Outcome: Adequate for Discharge   Problem: Coping: Goal: Level of anxiety will decrease Outcome: Adequate for Discharge   Problem: Elimination: Goal: Will not experience complications related to bowel motility Outcome: Adequate for Discharge Goal: Will not experience complications related to urinary retention Outcome: Adequate for Discharge   Problem: Pain Managment: Goal: General experience of comfort will improve Outcome: Adequate for Discharge   Problem: Safety: Goal: Ability to remain free from injury will improve Outcome: Adequate for Discharge   Problem: Skin Integrity: Goal: Risk for impaired skin integrity will decrease Outcome: Adequate for Discharge   Problem: Education: Goal: Understanding of cardiac disease, CV risk reduction, and recovery process will improve Outcome: Adequate for Discharge Goal: Individualized Educational Video(s) Outcome: Adequate for Discharge   Problem:  Activity: Goal: Ability to tolerate increased activity will improve Outcome: Adequate for Discharge   Problem: Cardiac: Goal: Ability to achieve and maintain adequate cardiovascular perfusion will improve Outcome: Adequate for Discharge   Problem: Health Behavior/Discharge Planning: Goal: Ability to safely manage health-related needs after discharge will improve Outcome: Adequate for Discharge   Problem: Education: Goal: Understanding of CV disease, CV risk reduction, and recovery process will improve Outcome: Adequate for Discharge Goal: Individualized Educational Video(s) Outcome: Adequate for Discharge   Problem: Activity: Goal: Ability to return to baseline activity level will improve Outcome: Adequate for Discharge   Problem: Cardiovascular: Goal: Ability to achieve and maintain adequate cardiovascular perfusion will improve Outcome: Adequate for Discharge Goal: Vascular access site(s) Level 0-1 will be maintained Outcome: Adequate for Discharge   Problem: Health Behavior/Discharge Planning: Goal: Ability to safely manage health-related needs after discharge will improve Outcome: Adequate for Discharge   

## 2022-08-19 NOTE — Care Management (Signed)
Patient left before I could see him. Called his cell, he stated that he had a set of Brilinta coupons. No other TOC needs identified.

## 2022-08-19 NOTE — Discharge Summary (Signed)
Discharge Summary    Patient ID: ISMEAL HEIDER MRN: [id number]; DOB: 03/10/1976  Admit date: 08/17/2022 Discharge date: 08/19/2022  PCP:  Noreene Larsson, NP   [facility] Providers Cardiologist:  Sherren Mocha, MD   Discharge Diagnoses    Principal Problem:   STEMI involving left circumflex coronary artery [facility]) Active Problems:   Hyperlipidemia LDL goal <70   Current smoker   Polysubstance use disorder   Diagnostic Studies/Procedures    Left heart cath 08/17/22:   Mid Cx lesion is 75% stenosed.   Prox LAD to Mid LAD lesion is 50% stenosed.   A drug-eluting stent was successfully placed using a STENT MEGATRON 4.5X20.   Post intervention, there is a 0% residual stenosis.   LV end diastolic pressure is normal.   The left ventricular ejection fraction is 45-50% by visual estimate.   1.  Widely patent left main 2.  Widely patent and dominant RCA 3.  Patent LAD with mild to moderate 50% in-stent restenosis in the mid vessel stent 4.  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 Mild segmental LV dysfunction with anterolateral hypokinesis and LVEF estimated at 45-50%   Recommendations: Check 2D echo, post MI medical care, continue tirofiban x 2 hours, DAPT with aspirin and ticagrelor x 12 months, lifestyle modification.  If patient clinically stable, he could be discharged tomorrow.   _____________  Echo 08/17/22: 1. Left ventricular ejection fraction, by estimation, is 55 to 60%. The  left ventricle has normal function. The left ventricle has no regional  wall motion abnormalities. There is mild left ventricular hypertrophy.  Left ventricular diastolic parameters  were normal.   2. Right ventricular systolic function is normal. The right ventricular  size is normal.   3. The mitral valve is normal in structure. Trivial mitral valve   regurgitation. No evidence of mitral stenosis.   4. The aortic valve is tricuspid. Aortic valve regurgitation is not  visualized. No aortic stenosis is present.   5. There is mild dilatation of the aortic root, measuring 38 mm.   6. The inferior vena cava is normal in size with greater than 50%  respiratory variability, suggesting right atrial pressure of 3 mmHg.    History of Present Illness     Drew Hubbard is a 47 y.o. male with a history of CAD with a MI in 12/2007 (age 40) s/p PTCA stent to LAD and polysubstance use (tobacco, marijuana, cocaine use) who presented for sudden onset chest pain and was found to have an inferolateral STEMI.   Drew Hubbard is a 47 year old male with the above history.  He was admitted with a NSTEMI in 11/2007 and LHC at that time showed 30% stenosis of the proximal to mid LAD with otherwise no CAD. LVEF was 60%. Medical therapy was recommended.  He was readmitted in 12/2007 with an MI. Urine drug screen was positive for cocaine and THC at that time. I am unable to see the full cardiac catheterization report from that time but per discharge summary LHC showed 52% stenosis with ruptured flap of the LAD which was treated with PTCA stent.    Patient presented to the [facility] ED 08/17/22 for evaluation of chest pain.  He was in his usual state of health until around 4 AM this morning when he developed substernal chest pain that he described as a pressure/burning  sensation.  Per ED note, he stated it was worse when laying down flat. No associated shortness of breath, nausea, vomiting, diaphoresis. EKG showed normal sinus rhythm with ST elevations in all inferior leads as well as leads V4-V6 and ST depression in lead aVL.  Code STEMI was called.  Started on an IV heparin drip and given aspirin 324 mg as well as 3 doses of sublingual Nitro and then transferred to [facility] for emergent cardiac catheterization. Upon arrival to [facility], he was still having 5/10 chest pain but  did not look to be uncomfortable or in any distress.    It does not sound like he has had very consistent medical care since the time of his PCI.  He cannot tell us the last time he was seen by medical provider.  He does not take any medications regularly.  He states he takes Aspirin every now and then.  He continues to smoke cigarettes.  He also endorses some marijuana use but denies any other drug use.  He does have a history of cocaine use.   Labs/ Imaging from [facility]: High-sensitivity troponin 480. Chest x-ray showed no acute findings. WBC 7.3, Hgb 14.1, Plts 283. Na 134, K 3.9, Glucose 90, BUN 14, Cr 1.17. LFTs normal.  Hospital Course     Consultants: none  STEMI - inferolateral STE CAD with prior LAD PTCA Second troponin resulted at > 10k. Pt was taken emergently to the cath lab for revascularization. He was still having 5/10 chest pain. LHC found 75% stenosis in the mid LCS successfully treated with 4.5 x 20 mm DES. He tolerated the procedure well and was placed on ASA and brilinta x 12 months. Follow up echocardiogram showed preserved EF Risk factor management was recommended. A1c 5.5%.    Hyperlipidemia with LDL goal < 70 08/17/2022: Cholesterol 143; HDL 60; LDL Cholesterol 66; Triglycerides 87; VLDL 17 Continue 80 mg lipitor  LP (a) is pending   Current smoker Recommended cessation   Polysubstance abuse UDS pan-positive: opiates, cocaine, benzos, amphetamines, THC Cessation has been recommended   Disposition Has had poor compliance with medications and follow up.  He understands he needs to go to the pharmacy on his way home for brilinta.    Did the patient have an acute coronary syndrome (MI, NSTEMI, STEMI, etc) this admission?:  Yes                               AHA/ACC Clinical Performance & Quality Measures: Aspirin prescribed? - Yes ADP Receptor Inhibitor (Plavix/Clopidogrel, Brilinta/Ticagrelor or Effient/Prasugrel) prescribed (includes medically managed  patients)? - Yes Beta Blocker prescribed? - No - cocaine positive High Intensity Statin (Lipitor 40-80mg  or Crestor 20-40mg ) prescribed? - Yes EF assessed during THIS hospitalization? - Yes For EF <40%, was ACEI/ARB prescribed? - Not Applicable (EF >/= 35%) For EF <40%, Aldosterone Antagonist (Spironolactone or Eplerenone) prescribed? - Not Applicable (EF >/= 57%) Cardiac Rehab Phase II ordered (including medically managed patients)? - Yes       The patient will be scheduled for a TOC follow up appointment in 7-14 days.  A message has been sent to the [facility] and Scheduling Pool at the office where the patient should be seen for follow up.  _____________  Discharge Vitals Blood pressure (!) 124/97, pulse 78, temperature 98 F (36.7 C), temperature source Oral, resp. rate 18, height 5\' 9"  (1.753 m), weight 87.1 kg, SpO2 99 %.  Filed Weights   08/17/22 0835 08/18/22 1605 08/19/22 0451  Weight: 90.7 kg 85.5 kg 87.1 kg    Labs & Radiologic Studies    CBC Recent Labs    08/17/22 0840 08/17/22 1444 08/18/22 0417 08/19/22 0036  WBC 7.3   < > 6.6 6.6  NEUTROABS 4.0  --   --   --   HGB 14.1   < > 13.7 12.9*  HCT 43.2   < > 40.5 38.9*  MCV 88.0   < > 86.7 87.2  PLT 283   < > 240 228   < > = values in this interval not displayed.   Basic Metabolic Panel Recent Labs    84/13/24 0840 08/17/22 1444 08/18/22 0417  NA 134*  --  138  K 3.9  --  3.9  CL 99  --  108  CO2 25  --  20*  GLUCOSE 90  --  123*  BUN 14  --  7  CREATININE 1.17 1.21 1.20  CALCIUM 9.2  --  8.9   Liver Function Tests Recent Labs    08/17/22 0840  AST 34  ALT 30  ALKPHOS 70  BILITOT 0.5  PROT 7.8  ALBUMIN 4.4   No results for input(s): "LIPASE", "AMYLASE" in the last 72 hours. High Sensitivity Troponin:   Recent Labs  Lab 08/17/22 0840 08/17/22 1444  TROPONINIHS 480* 10,279*    BNP Invalid input(s): "POCBNP" D-Dimer No results for input(s): "DDIMER" in the last 72 hours. Hemoglobin  A1C Recent Labs    08/17/22 0840  HGBA1C 5.5   Fasting Lipid Panel Recent Labs    08/17/22 0840  CHOL 143  HDL 60  LDLCALC 66  TRIG 87  CHOLHDL 2.4   Thyroid Function Tests No results for input(s): "TSH", "T4TOTAL", "T3FREE", "THYROIDAB" in the last 72 hours.  Invalid input(s): "FREET3" _____________  ECHOCARDIOGRAM COMPLETE  Result Date: 08/17/2022    ECHOCARDIOGRAM REPORT   Patient Name:   DENARIO BAGOT Date of Exam: 08/17/2022 Medical Rec #:  [id number]       Height:       69.0 in Accession #:    [id number]      Weight:       200.0 lb Date of Birth:  09/11/1975       BSA:          2.066 m Patient Age:    46 years        BP:           122/72 mmHg Patient Gender: M               HR:           75 bpm. Exam Location:  Inpatient Procedure: 2D Echo, Cardiac Doppler and Color Doppler Indications:    R07.9* Chest pain, unspecified  History:        Patient has no prior history of Echocardiogram examinations. CAD                 and Previous Myocardial Infarction; Signs/Symptoms:Chest Pain.  Sonographer:    Dondra Prader RVT RCS Referring Phys: [phone] MICHAEL COOPER IMPRESSIONS  1. Left ventricular ejection fraction, by estimation, is 55 to 60%. The left ventricle has normal function. The left ventricle has no regional wall motion abnormalities. There is mild left ventricular hypertrophy. Left ventricular diastolic parameters were normal.  2. Right ventricular systolic function is normal. The right ventricular size is normal.  3. The mitral valve is  normal in structure. Trivial mitral valve regurgitation. No evidence of mitral stenosis.  4. The aortic valve is tricuspid. Aortic valve regurgitation is not visualized. No aortic stenosis is present.  5. There is mild dilatation of the aortic root, measuring 38 mm.  6. The inferior vena cava is normal in size with greater than 50% respiratory variability, suggesting right atrial pressure of 3 mmHg. FINDINGS  Left Ventricle: Left ventricular ejection fraction,  by estimation, is 55 to 60%. The left ventricle has normal function. The left ventricle has no regional wall motion abnormalities. The left ventricular internal cavity size was normal in size. There is  mild left ventricular hypertrophy. Left ventricular diastolic parameters were normal. Right Ventricle: The right ventricular size is normal. No increase in right ventricular wall thickness. Right ventricular systolic function is normal. Left Atrium: Left atrial size was normal in size. Right Atrium: Right atrial size was normal in size. Pericardium: There is no evidence of pericardial effusion. Mitral Valve: The mitral valve is normal in structure. Trivial mitral valve regurgitation. No evidence of mitral valve stenosis. Tricuspid Valve: The tricuspid valve is normal in structure. Tricuspid valve regurgitation is mild . No evidence of tricuspid stenosis. Aortic Valve: The aortic valve is tricuspid. Aortic valve regurgitation is not visualized. No aortic stenosis is present. Aortic valve mean gradient measures 4.0 mmHg. Aortic valve peak gradient measures 6.2 mmHg. Aortic valve area, by VTI measures 3.82 cm. Pulmonic Valve: The pulmonic valve was normal in structure. Pulmonic valve regurgitation is not visualized. No evidence of pulmonic stenosis. Aorta: The aortic root is normal in size and structure. There is mild dilatation of the aortic root, measuring 38 mm. Venous: The inferior vena cava is normal in size with greater than 50% respiratory variability, suggesting right atrial pressure of 3 mmHg. IAS/Shunts: No atrial level shunt detected by color flow Doppler.  LEFT VENTRICLE PLAX 2D LVIDd:         5.30 cm   Diastology LVIDs:         3.90 cm   LV e' medial:    10.40 cm/s LV PW:         0.90 cm   LV E/e' medial:  8.4 LV IVS:        1.20 cm   LV e' lateral:   10.40 cm/s LVOT diam:     2.20 cm   LV E/e' lateral: 8.4 LV SV:         84 LV SV Index:   41 LVOT Area:     3.80 cm  RIGHT VENTRICLE             IVC RV  Basal diam:  3.30 cm     IVC diam: 1.60 cm RV Mid diam:    2.10 cm RV S prime:     14.90 cm/s TAPSE (M-mode): 2.3 cm LEFT ATRIUM             Index        RIGHT ATRIUM           Index LA diam:        3.40 cm 1.65 cm/m   RA Area:     14.80 cm LA Vol (A2C):   57.7 ml 27.93 ml/m  RA Volume:   38.60 ml  18.68 ml/m LA Vol (A4C):   32.9 ml 15.92 ml/m LA Biplane Vol: 43.4 ml 21.01 ml/m  AORTIC VALVE  PULMONIC VALVE AV Area (Vmax):    4.20 cm     PV Vmax:       0.88 m/s AV Area (Vmean):   3.81 cm     PV Peak grad:  3.1 mmHg AV Area (VTI):     3.82 cm AV Vmax:           124.00 cm/s AV Vmean:          88.100 cm/s AV VTI:            0.221 m AV Peak Grad:      6.2 mmHg AV Mean Grad:      4.0 mmHg LVOT Vmax:         137.00 cm/s LVOT Vmean:        88.300 cm/s LVOT VTI:          0.222 m LVOT/AV VTI ratio: 1.00  AORTA Ao Root diam: 3.80 cm MITRAL VALVE MV Area (PHT): 3.43 cm    SHUNTS MV Decel Time: 221 msec    Systemic VTI:  0.22 m MV E velocity: 87.30 cm/s  Systemic Diam: 2.20 cm MV A velocity: 71.20 cm/s MV E/A ratio:  1.23 Charlton Haws MD Electronically signed by Charlton Haws MD Signature Date/Time: 08/17/2022/2:47:45 PM    Final    CARDIAC CATHETERIZATION  Result Date: 08/17/2022   Mid Cx lesion is 75% stenosed.   Prox LAD to Mid LAD lesion is 50% stenosed.   A drug-eluting stent was successfully placed using a STENT MEGATRON 4.5X20.   Post intervention, there is a 0% residual stenosis.   LV end diastolic pressure is normal.   The left ventricular ejection fraction is 45-50% by visual estimate. 1.  Widely patent left main 2.  Widely patent and dominant RCA 3.  Patent LAD with mild to moderate 50% in-stent restenosis in the mid vessel stent 4.  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 Mild segmental LV dysfunction with anterolateral hypokinesis and LVEF estimated at  45-50% Recommendations: Check 2D echo, post MI medical care, continue tirofiban x 2 hours, DAPT with aspirin and ticagrelor x 12 months, lifestyle modification.  If patient clinically stable, he could be discharged tomorrow.   DG Chest Port 1 View  Result Date: 08/17/2022 CLINICAL DATA:  Chest pain. EXAM: PORTABLE CHEST 1 VIEW COMPARISON:  07/08/2018 FINDINGS: The lungs are clear without focal pneumonia, edema, pneumothorax or pleural effusion. The cardiopericardial silhouette is within normal limits for size. The visualized bony structures of the thorax are unremarkable. Telemetry leads overlie the chest. IMPRESSION: No active disease. Electronically Signed   By: Kennith Center M.D.   On: 08/17/2022 08:58   Disposition   Pt is being discharged home today in good condition.  Follow-up Plans & Appointments     Follow-up Information     Beatrice Lecher, PA-C Follow up on 08/28/2022.   Specialties: Cardiology, Physician Assistant Why: 2:45 pm Contact information: [address] [place] [address] [phone]                Discharge Instructions     AMB referral to cardiac rehabilitation   Complete by: As directed    Diagnosis:  Coronary Stents STEMI     After initial evaluation and assessments completed: Virtual Based Care may be provided alone or in conjunction with Phase 2 Cardiac Rehab based on patient barriers.:  Yes   Intensive Cardiac Rehabilitation (ICR) [place] location only OR Traditional Cardiac Rehabilitation (TCR) *If criteria for ICR are not met will enroll in TCR [facility] only): Yes   Diet - low sodium heart healthy   Complete by: As directed    Discharge instructions   Complete by: As directed    No driving for 7 days. No lifting over 5 lbs for 1 week. No sexual activity for 1 week. You should stay out of work until evaluated by cardiology at follow up. Keep procedure site clean & dry. If you notice increased pain, swelling, bleeding or pus,  call/return!  You may shower, but no soaking baths/hot tubs/pools for 1 week.   Increase activity slowly   Complete by: As directed         Discharge Medications   Allergies as of 08/19/2022   No Known Allergies      Medication List     STOP taking these medications    erythromycin ophthalmic ointment   guaiFENesin-codeine 100-10 MG/5ML syrup Commonly known as: ROBITUSSIN AC       TAKE these medications    aspirin EC 81 MG tablet Take 1 tablet (81 mg total) by mouth daily. Swallow whole. Start taking on: August 20, 2022 What changed:  medication strength how much to take additional instructions   atorvastatin 80 MG tablet Commonly known as: LIPITOR Take 1 tablet (80 mg total) by mouth daily. Start taking on: August 20, 2022   nitroGLYCERIN 0.4 MG SL tablet Commonly known as: NITROSTAT Place 1 tablet (0.4 mg total) under the tongue every 5 (five) minutes as needed for chest pain.   ticagrelor 90 MG Tabs tablet Commonly known as: BRILINTA Take 1 tablet (90 mg total) by mouth 2 (two) times daily.           Outstanding Labs/Studies     Duration of Discharge Encounter   Greater than 30 minutes including physician time.  Signed, [place], PA 08/19/2022, 12:24 PM

## 2022-08-20 ENCOUNTER — Telehealth: Payer: Self-pay

## 2022-08-20 NOTE — Telephone Encounter (Signed)
**Note De-Identified Wadell Craddock Obfuscation** Patient contacted regarding discharge from [facility] on 08/19/2022.  Patient understands to follow up with provider Richardson Dopp on 08/28/2022 at 2:45 at [address], [place] in [place], [place] [address]. Patient understands discharge instructions? yes Patient understands medications and regiment? yes Patient understands to bring all medications to this visit? yes  Ask patient:  Are you enrolled in My Chart: Yes  The pt states that he is doing well and he denies having any CP/discomfort, SOB, nausea, vomiting, diaphoresis, dizziness, or lightheadedness.  He states that he does have [facility] phone number and that he will call us if he has any questions or concerns. He thanked me for my call.

## 2022-08-20 NOTE — Telephone Encounter (Signed)
**Note De-identified Lukis Bunt Obfuscation** -----  **Note De-Identified Breezy Hertenstein Obfuscation** Message from Ledora Bottcher, [place] sent at 08/19/2022 12:08 PM EST ----- Pt needs a TOC phone call please.  Thanks Angie

## 2022-08-21 LAB — LIPOPROTEIN A (LPA): Lipoprotein (a): 47.2 nmol/L — ABNORMAL HIGH (ref <75.0)

## 2022-08-28 ENCOUNTER — Encounter: Payer: Self-pay | Admitting: Physician Assistant

## 2022-08-28 ENCOUNTER — Ambulatory Visit: Payer: BC Managed Care – PPO | Attending: Physician Assistant | Admitting: Physician Assistant

## 2022-08-28 VITALS — BP 130/88 | HR 87 | Ht 69.0 in | Wt 192.0 lb

## 2022-08-28 DIAGNOSIS — E785 Hyperlipidemia, unspecified: Secondary | ICD-10-CM

## 2022-08-28 DIAGNOSIS — I2121 ST elevation (STEMI) myocardial infarction involving left circumflex coronary artery: Secondary | ICD-10-CM | POA: Diagnosis not present

## 2022-08-28 DIAGNOSIS — F1990 Other psychoactive substance use, unspecified, uncomplicated: Secondary | ICD-10-CM | POA: Diagnosis not present

## 2022-08-28 DIAGNOSIS — R030 Elevated blood-pressure reading, without diagnosis of hypertension: Secondary | ICD-10-CM | POA: Diagnosis not present

## 2022-08-28 NOTE — Patient Instructions (Addendum)
Medication Instructions:  Your physician recommends that you continue on your current medications as directed. Please refer to the Current Medication list given to you today.  PLEASE REMEMBER TO TAKE THE BRILINTA TWICE DAILY  *If you need a refill on your cardiac medications before your next appointment, please call your pharmacy*   Lab Work: 10/12/22:  COME TO THE LAB, ANYTIME AFTER 7:15 A.M FASTING:  LIPID & CMET  If you have labs (blood work) drawn today and your tests are completely normal, you will receive your results only by: MyChart Message (if you have MyChart) OR A paper copy in the mail If you have any lab test that is abnormal or we need to change your treatment, we will call you to review the results.   Testing/Procedures: None ordered   Follow-Up: At [facility], you and your health needs are our priority.  As part of our continuing mission to provide you with exceptional heart care, we have created designated Provider Care Teams.  These Care Teams include your primary Cardiologist (physician) and Advanced Practice Providers (APPs -  Physician Assistants and Nurse Practitioners) who all work together to provide you with the care you need, when you need it.  We recommend signing up for the patient portal called "MyChart".  Sign up information is provided on this After Visit Summary.  MyChart is used to connect with patients for Virtual Visits (Telemedicine).  Patients are able to view lab/test results, encounter notes, upcoming appointments, etc.  Non-urgent messages can be sent to your provider as well.   To learn more about what you can do with MyChart, go to [email or web address].    Your next appointment:   3 month(s)  Provider:   Sherren Mocha, MD  or Richardson Dopp, PA-C         Other Instructions Please call [facility] to get established with a PCP [phone]  Your physician has requested that you regularly monitor and record your blood  pressure readings at home. Please use the same machine at the same time of day to check your readings and record them to bring to your follow-up visit.   Please monitor blood pressures and keep a log of your readings for 1 week then send a mychart message with those readings.    Make sure to check 2 hours after your medications.    AVOID these things for 30 minutes before checking your blood pressure: No Drinking caffeine. No Drinking alcohol. No Eating. No Smoking. No Exercising.   Five minutes before checking your blood pressure: Pee. Sit in a dining chair. Avoid sitting in a soft couch or armchair. Be quiet. Do not talk

## 2022-08-28 NOTE — Assessment & Plan Note (Signed)
He has decreased smoking down to 2 cigarettes a day.  I have encouraged him to continue to try to quit.  He also notes that he has not used cocaine since discharge from the hospital.

## 2022-08-28 NOTE — Progress Notes (Signed)
Cardiology Office Note:    Date:  08/28/2022   ID:  Drew Hubbard, DOB 1976-07-06, MRN [id number]  PCP:  Noreene Larsson, NP  [facility] Cardiologist:  Sherren Mocha, MD     Referring MD: Noreene Larsson, NP   Patient Profile: Coronary artery disease  S/p MI in 2009 tx w stent to LAD Inferolateral STEMI 08/2022 s/p 4.5 x 20 mm DES to [facility] 08/17/22: severe eccentric mLCx 75, mLAD stent patent w 50 ISR, EF 45-50 TTE 08/17/22: EF 55-60, no RWMA, mild LVH, NL RVSF, trivial MR, Ao root 38 mm, RAP 3 Hyperlipidemia  Tobacco use Hx of marijuana, cocaine use      History of Present Illness:   Drew Hubbard is a 47 y.o. male with the above problem list.  He was admitted 2/2-2/4 with inferolateral STEMI.  He initially presented to [facility] and was transferred to [facility].  Cardiac catheterization demonstrated plaque rupture and severe eccentric stenosis in the mid LCx.  This was treated with a DES.  His stent in the mid LAD was patent with 50% ISR.  Echocardiogram demonstrated normal EF.  Post PCI course was uneventful.  He returns for [facility] follow-up post MI.  He is here with his wife.  Unfortunately, he could not give Brilinta for 2 days after discharge from the hospital.  His pharmacy did not have it in stock.  Since starting on Brilinta, he has only been taking it once a day.  Fortunately, he has not had chest discomfort, shortness of breath.  He has not had syncope, orthopnea, leg edema.  He denies any further use of cocaine.  He has decreased his smoking to 2 cigarettes a day.  He is walking on a regular basis.  He walks his dog.  EKG: NSR, HR 81, inferolateral Q waves, no ST-T wave changes, QTc 411   Subjective    Reviewed and updated this encounter:  Tobacco  Allergies  Meds  Problems  Med Hx  Surg Hx  Fam Hx     ROS  Objective   Labs/Other Test Reviewed:   Recent Labs: 08/17/2022: ALT 30 08/18/2022: BUN 7; Creatinine, Ser 1.20; Potassium 3.9;  Sodium 138 08/19/2022: Hemoglobin 12.9; Platelets 228   Recent Lipid Panel Recent Labs    08/17/22 0840  CHOL 143  TRIG 87  HDL 60  VLDL 17  LDLCALC 66     Risk Assessment/Calculations/Metrics:             Physical Exam:   VS:  BP 130/88   Pulse 87   Ht 5' 9"$  (1.753 m)   Wt 192 lb (87.1 kg)   SpO2 99%   BMI 28.35 kg/m    Wt Readings from Last 3 Encounters:  08/28/22 192 lb (87.1 kg)  08/19/22 192 lb (87.1 kg)  07/08/18 185 lb (83.9 kg)    Constitutional:      Appearance: Healthy appearance. Not in distress.  Neck:     Vascular: JVD normal.  Pulmonary:     Effort: Pulmonary effort is normal.     Breath sounds: No wheezing. No rales.  Cardiovascular:     Normal rate. Regular rhythm. Normal S1. Normal S2.      Murmurs: There is no murmur.     Comments: Right wrist without hematoma Edema:    Peripheral edema absent.  Abdominal:     Palpations: Abdomen is soft.      Assessment & Plan  ASSESSMENT & PLAN:   STEMI involving left circumflex coronary artery [facility]) He is doing well after recent inferolateral STEMI treated with DES to the mid LCx.  He had a prior myocardial infarction 2009 treated with stenting to the LAD.  LAD stent was patent on most recent cardiac catheterization.  EF is preserved at 55-60 on echocardiogram during his admission to the hospital.  He is not having chest discomfort to suggest angina.  Unfortunately, he missed 2 days of Brilinta and has only been taking it once daily since then.  We discussed the importance of dual antiplatelet therapy after acute coronary syndrome treated with PCI.  He knows to contact us if he is unable to get his Brilinta from the pharmacy or is unable to afford it.  I have encouraged him to pursue cardiac rehabilitation.  We discussed the importance of quitting smoking.  Of note, he was not started on beta-blocker therapy in the hospital due to concerns of recent cocaine use.  He denies any further cocaine use.  His blood  pressure was borderline elevated today.  If it remains elevated, consider adding nonselective beta-blocker (carvedilol). Continue aspirin 81 mg, Brilinta 90 mg twice daily, Lipitor 80 mg daily, as needed nitroglycerin Start cardiac rehab Follow-up 3 months  Elevated blood pressure reading He has not had a history of hypertension.  Blood pressure in the office today is somewhat elevated.  We discussed the importance of a goal <130/80.  I have asked him to monitor his blood pressure over the next week and send me readings for review.  Have also provided heart healthy diet.  As noted, if his blood pressure remains above target, I will place him on low-dose carvedilol (nonselective beta-blocker) given his history of cocaine use in the past.  Hyperlipidemia LDL goal <70 Continue atorvastatin 80 mg daily.  Arrange fasting CMET, lipids.  Polysubstance use disorder He has decreased smoking down to 2 cigarettes a day.  I have encouraged him to continue to try to quit.  He also notes that he has not used cocaine since discharge from the hospital.      Cardiac Rehabilitation Eligibility Assessment  The patient is ready to start cardiac rehabilitation from a cardiac standpoint.      Dispo:  Return in about 3 months (around 11/26/2022) for Routine Follow Up, w/ Dr. Burt Knack, or Richardson Dopp, PA-C.   Signed, Richardson Dopp, PA-C  08/28/2022 5:49 PM    [facility], [place], [place]  [address] Phone: [phone]; Fax: [phone]

## 2022-08-28 NOTE — Assessment & Plan Note (Signed)
Continue atorvastatin 80 mg daily.  Arrange fasting CMET, lipids.

## 2022-08-28 NOTE — Assessment & Plan Note (Signed)
He has not had a history of hypertension.  Blood pressure in the office today is somewhat elevated.  We discussed the importance of a goal <130/80.  I have asked him to monitor his blood pressure over the next week and send me readings for review.  Have also provided heart healthy diet.  As noted, if his blood pressure remains above target, I will place him on low-dose carvedilol (nonselective beta-blocker) given his history of cocaine use in the past.

## 2022-08-28 NOTE — Assessment & Plan Note (Signed)
He is doing well after recent inferolateral STEMI treated with DES to the mid LCx.  He had a prior myocardial infarction 2009 treated with stenting to the LAD.  LAD stent was patent on most recent cardiac catheterization.  EF is preserved at 55-60 on echocardiogram during his admission to the hospital.  He is not having chest discomfort to suggest angina.  Unfortunately, he missed 2 days of Brilinta and has only been taking it once daily since then.  We discussed the importance of dual antiplatelet therapy after acute coronary syndrome treated with PCI.  He knows to contact us if he is unable to get his Brilinta from the pharmacy or is unable to afford it.  I have encouraged him to pursue cardiac rehabilitation.  We discussed the importance of quitting smoking.  Of note, he was not started on beta-blocker therapy in the hospital due to concerns of recent cocaine use.  He denies any further cocaine use.  His blood pressure was borderline elevated today.  If it remains elevated, consider adding nonselective beta-blocker (carvedilol). Continue aspirin 81 mg, Brilinta 90 mg twice daily, Lipitor 80 mg daily, as needed nitroglycerin Start cardiac rehab Follow-up 3 months

## 2022-10-12 ENCOUNTER — Ambulatory Visit: Payer: BC Managed Care – PPO | Attending: Nurse Practitioner

## 2022-11-28 ENCOUNTER — Ambulatory Visit: Payer: BC Managed Care – PPO | Attending: Physician Assistant | Admitting: Physician Assistant

## 2022-11-28 DIAGNOSIS — I2510 Atherosclerotic heart disease of native coronary artery without angina pectoris: Secondary | ICD-10-CM | POA: Insufficient documentation

## 2022-11-28 NOTE — Progress Notes (Deleted)
Cardiology Office Note:    Date:  11/28/2022  ID:  Drew Hubbard, DOB 1975/07/31, MRN [id number] PCP: Drew Roberts, NP  [facility] Cardiologist:  Drew Bollman, MD { Click to update primary MD,subspecialty MD or APP then REFRESH:1}    {Click to Open Review  :1}      Patient Profile:   Coronary artery disease  S/p MI in 2009 tx w stent to LAD Inferolateral STEMI 08/2022 s/p 4.5 x 20 mm DES to mLCx LHC 08/17/22: severe eccentric mLCx 75, mLAD stent patent w 50 ISR, EF 45-50 TTE 08/17/22: EF 55-60, no RWMA, mild LVH, NL RVSF, trivial MR, Ao root 38 mm, RAP 3 Hyperlipidemia  Tobacco use Hx of marijuana, cocaine use         History of Present Illness:   Drew Hubbard is a 47 y.o. male *** He was last seen for posthospitalization follow-up 08/28/2022.  He had had difficulty getting Brilinta and was only taking it once daily.  He missed 2 days of Brilinta. He was started back on this and we reviewed the importance of DAPT post MI. ***  ROS ***    Studies Reviewed:    EKG:  ***  *** Risk Assessment/Calculations:   {Does this patient have ATRIAL FIBRILLATION?[phone]} No BP recorded.  {Refresh Note OR Click here to enter BP  :1}***       Physical Exam:   VS:  There were no vitals taken for this visit.   Wt Readings from Last 3 Encounters:  08/28/22 192 lb (87.1 kg)  08/19/22 192 lb (87.1 kg)  07/08/18 185 lb (83.9 kg)    Physical Exam***    ASSESSMENT AND PLAN:   No problem-specific Assessment & Plan notes found for this encounter. ***{   STEMI involving left circumflex coronary artery [facility]) He is doing well after recent inferolateral STEMI treated with DES to the mid LCx.  He had a prior myocardial infarction 2009 treated with stenting to the LAD.  LAD stent was patent on most recent cardiac catheterization.  EF is preserved at 55-60 on echocardiogram during his admission to the hospital.  He is not having chest discomfort to suggest angina.   Unfortunately, he missed 2 days of Brilinta and has only been taking it once daily since then.  We discussed the importance of dual antiplatelet therapy after acute coronary syndrome treated with PCI.  He knows to contact us if he is unable to get his Brilinta from the pharmacy or is unable to afford it.  I have encouraged him to pursue cardiac rehabilitation.  We discussed the importance of quitting smoking.  Of note, he was not started on beta-blocker therapy in the hospital due to concerns of recent cocaine use.  He denies any further cocaine use.  His blood pressure was borderline elevated today.  If it remains elevated, consider adding nonselective beta-blocker (carvedilol). Continue aspirin 81 mg, Brilinta 90 mg twice daily, Lipitor 80 mg daily, as needed nitroglycerin Start cardiac rehab Follow-up 3 months   Elevated blood pressure reading He has not had a history of hypertension.  Blood pressure in the office today is somewhat elevated.  We discussed the importance of a goal <130/80.  I have asked him to monitor his blood pressure over the next week and send me readings for review.  Have also provided heart healthy diet.  As noted, if his blood pressure remains above target, I will place him on low-dose carvedilol (nonselective beta-blocker) given  his history of cocaine use in the past.   Hyperlipidemia LDL goal <70 Continue atorvastatin 80 mg daily.  Arrange fasting CMET, lipids.   Polysubstance use disorder He has decreased smoking down to 2 cigarettes a day.  I have encouraged him to continue to try to quit.  He also notes that he has not used cocaine since discharge from the hospital.    :1}     {Are you ordering a CV Procedure (e.g. stress test, cath, DCCV, TEE, etc)?   Press F2        :[id number]}  Dispo:  No follow-ups on file.  Signed, Drew Newcomer, PA-C
